# Patient Record
Sex: Male | Born: 1985 | Race: White | Hispanic: No | Smoking: Current every day smoker
Health system: Southern US, Community
[De-identification: ages and names within clinical notes are randomized; demographics above are authoritative.]

## PROBLEM LIST (undated history)

## (undated) DIAGNOSIS — F909 Attention-deficit hyperactivity disorder, unspecified type: Secondary | ICD-10-CM

## (undated) DIAGNOSIS — F410 Panic disorder [episodic paroxysmal anxiety] without agoraphobia: Secondary | ICD-10-CM

## (undated) HISTORY — PX: MOUTH SURGERY: SHX715

---
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
## 2011-01-09 ENCOUNTER — Emergency Department (HOSPITAL_COMMUNITY)
Admission: EM | Admit: 2011-01-09 | Discharge: 2011-01-09 | Payer: Self-pay | Source: Home / Self Care | Admitting: Family Medicine

## 2011-01-10 LAB — POCT RAPID STREP A (OFFICE): Streptococcus, Group A Screen (Direct): POSITIVE — AB

## 2011-05-05 NOTE — Op Note (Signed)
[place]  Patient:    Brandon Hartman, Brandon Hartman                      MRN: [id number] Proc. Date: 05/01/00 Adm. Date:  [id number] Disc. Date: [id number] Attending:  Twana First                           Operative Report  PREOPERATIVE DIAGNOSIS:  Left distal radius and ulna fractures.  POSTOPERATIVE DIAGNOSIS:  Left distal radius and ulna fractures.  OPERATION PERFORMED:  Closed reduction of left distal radius and ulna fractures.  SURGEON:  Elana Alm. Thurston Hole, M.D.  ASSISTANT:  Kirstin Adelberger, P.A.  ANESTHESIA:  General.  OPERATIVE TIME:  30 minutes.  COMPLICATIONS:  None.  INDICATIONS FOR PROCEDURE:  Brandon Hartman is a 25 year old who sustained a displaced distal radius and ulnar fracture last night playing basketball.  Attempted closed reduction in the emergency department last night under IV sedation was unsuccessful and thus, he is to undergo closed reduction versus open reduction, possible pinning under general anesthesia today.  DESCRIPTION OF PROCEDURE:  Brandon Hartman was brought to the operating room on May 01, 2000 and placed on the operating table in supine position.  After an adequate level of general anesthesia was obtained, he had his wrist placed in finger traps and 10 pounds of longitudinal traction.  His splint was removed. Intraoperative fluoroscopy showed the displacement of the distal radius and ulna fractures.  A closed reduction maneuver was performed with traction. Hyperdorsiflexion followed by volar flexion thus reducing the fracture into an anatomic position.  AP and lateral x-rays confirmed anatomic reduction of the fracture and stable reduction was achieved.  Under fluoroscopy the fracture and wrist was moved and only in a hyperdorsiflexion position did the fracture move.  Because of this, I did not feel it was necessary to perform a pinning for stabilization.  He was then placed in a sugartong volarflexed splint and after the  splint hardened, further x-rays were obtained showing that the fracture was maintained in an anatomic position.  After this was done, he was awakened and taken to recovery room in stable condition.  FOLLOW-UP:  He will be treated as an outpatient on Vicodin for pain.  See him back in the office in a week for repeat x-ray and evaluation. DD:  05/01/00 TD:  05/03/00 Job: [id number]

## 2012-03-27 DIAGNOSIS — S0180XA Unspecified open wound of other part of head, initial encounter: Secondary | ICD-10-CM | POA: Insufficient documentation

## 2012-03-27 DIAGNOSIS — W219XXA Striking against or struck by unspecified sports equipment, initial encounter: Secondary | ICD-10-CM | POA: Insufficient documentation

## 2012-03-27 DIAGNOSIS — Z23 Encounter for immunization: Secondary | ICD-10-CM | POA: Insufficient documentation

## 2012-03-28 ENCOUNTER — Emergency Department (HOSPITAL_COMMUNITY)
Admission: EM | Admit: 2012-03-28 | Discharge: 2012-03-28 | Disposition: A | Discharge status: Home / Self Care | Payer: Self-pay | Attending: Emergency Medicine | Admitting: Emergency Medicine

## 2012-03-28 ENCOUNTER — Encounter (HOSPITAL_COMMUNITY): Payer: Self-pay | Admitting: Emergency Medicine

## 2012-03-28 DIAGNOSIS — S0181XA Laceration without foreign body of other part of head, initial encounter: Secondary | ICD-10-CM

## 2012-03-28 MED ORDER — TETANUS-DIPHTH-ACELL PERTUSSIS 5-2.5-18.5 LF-MCG/0.5 IM SUSP
0.5000 mL | Freq: Once | INTRAMUSCULAR | Status: AC
  Administered 2012-03-28: 0.5 mL via INTRAMUSCULAR
  Filled 2012-03-28: qty 0.5

## 2012-03-28 NOTE — ED Provider Notes (Signed)
History     CSN: [id number]  Arrival date & time 03/27/12  2330   First MD Initiated Contact with Patient 03/28/12 0327      Chief Complaint  Patient presents with  . Facial Laceration    (Consider location/radiation/quality/duration/timing/severity/associated sxs/prior treatment) HPI The patient states he was elbowed tonight while playing basketball. He sustained a small laceration below his left eyebrow. The patient denies any loss of consciousness. He denies any double vision or difficulty with his vision. The patient was not sure if you need stitches so he came to the emergency department. History reviewed. No pertinent past medical history.  Past Surgical History  Procedure Date  . Mouth surgery     History reviewed. No pertinent family history.  History  Substance Use Topics  . Smoking status: Current Everyday Smoker    Types: Cigarettes  . Smokeless tobacco: Not on file  . Alcohol Use: No      Review of Systems  All other systems reviewed and are negative.    Allergies  Review of patient's allergies indicates no known allergies.  Home Medications  No current outpatient prescriptions on file.  BP 127/70  Pulse 80  Temp(Src) 98.3 F (36.8 C) (Oral)  Resp 16  SpO2 99%  Physical Exam  Nursing note and vitals reviewed. Constitutional: He appears well-developed and well-nourished. No distress.  HENT:  Head: Normocephalic and atraumatic.  Right Ear: External ear normal.  Left Ear: External ear normal.       Small approximately 1 cm laceration below the left eyebrow superior to the eyelid, no facial bone tenderness  Eyes: Conjunctivae are normal. Right eye exhibits no discharge. Left eye exhibits no discharge. No scleral icterus.  Neck: Neck supple. No tracheal deviation present.  Cardiovascular: Normal rate, regular rhythm and intact distal pulses.   Pulmonary/Chest: Effort normal and breath sounds normal. No stridor. No respiratory distress. He has no  wheezes. He has no rales.  Abdominal: Soft. Bowel sounds are normal. He exhibits no distension. There is no tenderness. There is no rebound and no guarding.  Musculoskeletal: He exhibits no edema and no tenderness.  Neurological: He is alert. He has normal strength. No sensory deficit. Cranial nerve deficit:  no gross defecits noted. He exhibits normal muscle tone. He displays no seizure activity. Coordination normal.  Skin: Skin is warm and dry. No rash noted.  Psychiatric: He has a normal mood and affect.    ED Course  LACERATION REPAIR Performed by: Celene Kras Authorized by: Linwood Dibbles R Consent: Verbal consent obtained. Consent given by: patient Patient identity confirmed: verbally with patient Body area: head/neck Location details: left eyebrow Laceration length: 1 cm Foreign bodies: no foreign bodies Tendon involvement: none Nerve involvement: none Vascular damage: no Irrigation solution: saline Amount of cleaning: standard Skin closure: glue Approximation: close Approximation difficulty: simple   (including critical care time)  Labs Reviewed - No data to display No results found.    MDM   Pt without sign of facial bone fracture or ocular injury.  Laceration repaired.  Pt tolerated well      Celene Kras, MD 03/28/12 [phone]

## 2012-03-28 NOTE — ED Notes (Signed)
derma bond at bedside

## 2012-03-28 NOTE — Discharge Instructions (Signed)
Tissue Adhesive Wound Care A wound can be repaired by using tissue adhesive. Tissue adhesive holds the skin together and allows faster healing. It forms a strong bond on the skin in about 1 minute and reaches its full strength in about 2 or 3 minutes. The adhesive disappears naturally while healing. Follow up is required if your caregiver wants to recheck for infection and to make sure your wound is healing properly.  You may need a tetanus shot if:  You cannot remember when you had your last tetanus shot.   You have never had a tetanus shot.   The injury broke your skin.  If you got a tetanus shot, your arm may swell, get red, and feel warm to the touch. This is common and not a problem. If you need a tetanus shot and you choose not to have one, there is a rare chance of getting tetanus. Sickness from tetanus can be serious. HOME CARE INSTRUCTIONS   Only take over-the-counter or prescription medicines for pain, discomfort, or fever as directed by your caregiver.   Showers are allowed. Do not soak the area containing the tissue adhesive. Do not take baths, swim, or use hot tubs. Do not use any soaps or ointments on the wound until it has healed.   If a bandage (dressing) has been applied, follow your caregiver's instructions for how often to change the dressing.   Keep the dressing dry if one has been applied.   Do not scratch, pick, or rub the adhesive.   Do not place tape over the adhesive. The adhesive could come off when pulling the tape off.   Protect the wound from further injury until it is healed.   Protect the wound from sun and tanning bed exposure while it is healing and for several weeks after healing.   Keep all follow-up appointments as directed by your caregiver.  SEEK IMMEDIATE MEDICAL CARE IF:   Your wound becomes red, swollen, hot, or tender.   You have increasing pain in the wound.   You have a red streak that goes away from the wound.   You have pus coming  from the wound.   You have a fever.   You have shaking chills.   There is a bad smell coming from the wound.   The wound or adhesive breaks open.  MAKE SURE YOU:   Understand these instructions.   Will watch your condition.   Will get help right away if you are not doing well or get worse.  Document Released: 05/30/2001 Document Revised: 11/23/2011 Document Reviewed: 04/09/2011 [employer] Patient Information 2012 [employer]. 

## 2012-03-28 NOTE — ED Notes (Signed)
Pt states tonight while playing basketball someone's elbow caught him right under his left eyebow  Pt has laceration noted  Bleeding controlled

## 2013-06-03 ENCOUNTER — Encounter (HOSPITAL_COMMUNITY): Payer: Self-pay | Admitting: Emergency Medicine

## 2013-06-03 ENCOUNTER — Emergency Department (INDEPENDENT_AMBULATORY_CARE_PROVIDER_SITE_OTHER)
Admission: EM | Admit: 2013-06-03 | Discharge: 2013-06-03 | Disposition: A | Discharge status: Home / Self Care | Payer: Self-pay | Source: Home / Self Care

## 2013-06-03 ENCOUNTER — Emergency Department (INDEPENDENT_AMBULATORY_CARE_PROVIDER_SITE_OTHER): Payer: Self-pay

## 2013-06-03 DIAGNOSIS — S90129A Contusion of unspecified lesser toe(s) without damage to nail, initial encounter: Secondary | ICD-10-CM

## 2013-06-03 DIAGNOSIS — S90122A Contusion of left lesser toe(s) without damage to nail, initial encounter: Secondary | ICD-10-CM

## 2013-06-03 HISTORY — DX: Attention-deficit hyperactivity disorder, unspecified type: F90.9

## 2013-06-03 MED ORDER — OXYCODONE-ACETAMINOPHEN 5-325 MG PO TABS: 1.0000 | ORAL_TABLET | ORAL | Status: DC | PRN

## 2013-06-03 NOTE — ED Notes (Signed)
Patient not ready for discharge, continued discussion on radiology report

## 2013-06-03 NOTE — ED Provider Notes (Signed)
History     CSN: [id number]  Arrival date & time 06/03/13  1002   First MD Initiated Contact with Patient 06/03/13 1020      Chief Complaint  Patient presents with  . Foot Pain    (Consider location/radiation/quality/duration/timing/severity/associated sxs/prior treatment) HPI Comments:  her 27 year old male was at work 4 days ago and dropped to the prior onto his left forefoot. Is complaining primarily of pain in the great toe. Denies other injury.   Past Medical History  Diagnosis Date  . ADHD (attention deficit hyperactivity disorder)     Past Surgical History  Procedure Laterality Date  . Mouth surgery      No family history on file.  History  Substance Use Topics  . Smoking status: Current Every Day Smoker    Types: Cigarettes  . Smokeless tobacco: Not on file  . Alcohol Use: No      Review of Systems  Constitutional: Negative.   Respiratory: Negative.   Gastrointestinal: Negative.   Genitourinary: Negative.   Musculoskeletal:       As per HPI  Skin: Negative.   Neurological: Negative for dizziness, weakness, numbness and headaches.    Allergies  Review of patient's allergies indicates no known allergies.  Home Medications   Current Outpatient Rx  Name  Route  Sig  Dispense  Refill  . Oxycodone-Acetaminophen (PERCOCET PO)   Oral   Take by mouth. Family members medicine         . oxyCODONE-acetaminophen (PERCOCET/ROXICET) 5-325 MG per tablet   Oral   Take 1 tablet by mouth every 4 (four) hours as needed for pain.   12 tablet   0     BP 135/81  Pulse 71  Temp(Src) 98.2 F (36.8 C) (Oral)  Resp 16  SpO2 99%  Physical Exam  Nursing note and vitals reviewed. Constitutional: He is oriented to person, place, and time. He appears well-developed and well-nourished.  HENT:  Head: Normocephalic and atraumatic.  Eyes: EOM are normal.  Neck: Normal range of motion. Neck supple.  Pulmonary/Chest: Effort normal.  Musculoskeletal:   Swelling, redness and tenderness to the left great toe. Lesser tenderness to the adjacent toes and across the forefoot. No deformity or discoloration. Distal neurovascular motor sensory is intact.  Neurological: He is alert and oriented to person, place, and time. No cranial nerve deficit.  Skin: Skin is warm and dry.  Psychiatric: He has a normal mood and affect.    ED Course  Procedures (including critical care time)  Labs Reviewed - No data to display Dg Foot Complete Left  06/03/2013   *RADIOLOGY REPORT*  Clinical Data: Left foot pain following an injury.  The patient reports left foot and ankle fractures approximately 3 years ago.  LEFT FOOT - COMPLETE 3+ VIEW  Comparison: None.  Findings: No fracture, dislocation or radiopaque foreign body. Spur formation arising from the distal medial malleolus and adjacent talus.  IMPRESSION: No fracture.   Original Report Authenticated By: Beckie Salts, M.D.     1. Toe contusion, left, initial encounter       MDM  Postop shoe. Limited weightbearing continue to wear crutches since it is so painful to bear weight and ambulate. Take toes together. And Percocet for pain #12. If not improving call the orthopedist above for appointment.         Hayden Rasmussen, NP 06/03/13 1146

## 2013-06-03 NOTE — ED Provider Notes (Signed)
Medical screening examination/treatment/procedure(s) were performed by resident physician or non-physician practitioner and as supervising physician I was immediately available for consultation/collaboration.   Barkley Bruns MD.   Linna Hoff, MD 06/03/13 [phone]

## 2013-06-03 NOTE — ED Notes (Signed)
Reports dropping a deep fryer on left foot.  Incident occurred on Friday.  Reports significant left great toe pain

## 2015-09-10 ENCOUNTER — Encounter (HOSPITAL_COMMUNITY): Payer: Self-pay | Admitting: Emergency Medicine

## 2015-09-10 ENCOUNTER — Emergency Department (HOSPITAL_COMMUNITY): Payer: Medicaid Other

## 2015-09-10 ENCOUNTER — Emergency Department (HOSPITAL_COMMUNITY)
Admission: EM | Admit: 2015-09-10 | Discharge: 2015-09-10 | Disposition: A | Discharge status: Home / Self Care | Payer: Medicaid Other | Attending: Emergency Medicine | Admitting: Emergency Medicine

## 2015-09-10 DIAGNOSIS — F1210 Cannabis abuse, uncomplicated: Secondary | ICD-10-CM | POA: Insufficient documentation

## 2015-09-10 DIAGNOSIS — F1310 Sedative, hypnotic or anxiolytic abuse, uncomplicated: Secondary | ICD-10-CM

## 2015-09-10 DIAGNOSIS — F1390 Sedative, hypnotic, or anxiolytic use, unspecified, uncomplicated: Secondary | ICD-10-CM | POA: Insufficient documentation

## 2015-09-10 DIAGNOSIS — Z720 Tobacco use: Secondary | ICD-10-CM | POA: Insufficient documentation

## 2015-09-10 DIAGNOSIS — R0602 Shortness of breath: Secondary | ICD-10-CM | POA: Diagnosis present

## 2015-09-10 DIAGNOSIS — R000 Tachycardia, unspecified: Secondary | ICD-10-CM | POA: Diagnosis not present

## 2015-09-10 LAB — CBC WITH DIFFERENTIAL/PLATELET
BASOS PCT: 0 %
Basophils Absolute: 0 10*3/uL (ref 0.0–0.1)
EOS ABS: 0.2 10*3/uL (ref 0.0–0.7)
EOS PCT: 2 %
HCT: 43.2 % (ref 39.0–52.0)
Hemoglobin: 15.1 g/dL (ref 13.0–17.0)
LYMPHS ABS: 3.7 10*3/uL (ref 0.7–4.0)
Lymphocytes Relative: 30 %
MCH: 31.7 pg (ref 26.0–34.0)
MCHC: 35 g/dL (ref 30.0–36.0)
MCV: 90.6 fL (ref 78.0–100.0)
MONOS PCT: 6 %
Monocytes Absolute: 0.8 10*3/uL (ref 0.1–1.0)
Neutro Abs: 7.8 10*3/uL — ABNORMAL HIGH (ref 1.7–7.7)
Neutrophils Relative %: 62 %
PLATELETS: 339 10*3/uL (ref 150–400)
RBC: 4.77 MIL/uL (ref 4.22–5.81)
RDW: 13.6 % (ref 11.5–15.5)
WBC: 12.5 10*3/uL — ABNORMAL HIGH (ref 4.0–10.5)

## 2015-09-10 LAB — I-STAT TROPONIN, ED
TROPONIN I, POC: 0 ng/mL (ref 0.00–0.08)
TROPONIN I, POC: 0 ng/mL (ref 0.00–0.08)

## 2015-09-10 LAB — RAPID URINE DRUG SCREEN, HOSP PERFORMED
AMPHETAMINES: NOT DETECTED
Barbiturates: NOT DETECTED
Benzodiazepines: POSITIVE — AB
Cocaine: NOT DETECTED
Opiates: NOT DETECTED
Tetrahydrocannabinol: POSITIVE — AB

## 2015-09-10 LAB — I-STAT CHEM 8, ED
BUN: 12 mg/dL (ref 6–20)
CALCIUM ION: 1.09 mmol/L — AB (ref 1.12–1.23)
Chloride: 100 mmol/L — ABNORMAL LOW (ref 101–111)
Creatinine, Ser: 1 mg/dL (ref 0.61–1.24)
Glucose, Bld: 123 mg/dL — ABNORMAL HIGH (ref 65–99)
HEMATOCRIT: 49 % (ref 39.0–52.0)
HEMOGLOBIN: 16.7 g/dL (ref 13.0–17.0)
Potassium: 3.3 mmol/L — ABNORMAL LOW (ref 3.5–5.1)
SODIUM: 137 mmol/L (ref 135–145)
TCO2: 21 mmol/L (ref 0–100)

## 2015-09-10 LAB — D-DIMER, QUANTITATIVE (NOT AT ARMC)

## 2015-09-10 MED ORDER — KETOROLAC TROMETHAMINE 30 MG/ML IJ SOLN: 30.0000 mg | Freq: Once | INTRAMUSCULAR | Status: DC

## 2015-09-10 MED ORDER — SODIUM CHLORIDE 0.9 % IV BOLUS (SEPSIS)
1000.0000 mL | Freq: Once | INTRAVENOUS | Status: AC
  Administered 2015-09-10: 1000 mL via INTRAVENOUS

## 2015-09-10 NOTE — ED Provider Notes (Addendum)
CSN: [id number]     Arrival date & time 09/10/15  0125 History   This chart was scribed for Brandon Palumbo, MD by Arlan Organ, ED Scribe. This patient was seen in room D35C/D35C and the patient's care was started 1:47 AM.   Chief Complaint  Patient presents with  . Shortness of Breath   Patient is a 29 y.o. male presenting with shortness of breath. The history is provided by the patient. No language interpreter was used.  Shortness of Breath Severity:  Moderate Onset quality:  Sudden Duration:  3 hours Timing:  Constant Progression:  Unchanged Chronicity:  Recurrent Context: not activity and not URI   Relieved by:  Position changes Worsened by:  Nothing tried Ineffective treatments:  None tried Associated symptoms: no abdominal pain, no chest pain, no cough, no fever, no headaches, no rash and no vomiting   Risk factors: tobacco use   Risk factors: no recent alcohol use, no hx of PE/DVT and no prolonged immobilization     HPI Comments: Brandon Hartman is a 29 y.o. male without any pertinent past medical history who presents to the Emergency Department complaining of constant, ongoing shortness of breath onset this evening. Pt states SOB is improved when flat. He states "i feel like my heart is racing". Mr. Brandon Hartman admits to a history of same 8 months ago. Denies any new medications. No recent trauma or falls. No recent long distance travel. He admits to Marijuana use this evening. In addition, he is every day smoker and smokes 1 pack of cigarettes daily.   Past Medical History  Diagnosis Date  . ADHD (attention deficit hyperactivity disorder)    Past Surgical History  Procedure Laterality Date  . Mouth surgery     No family history on file. Social History  Substance Use Topics  . Smoking status: Current Every Day Smoker    Types: Cigarettes  . Smokeless tobacco: Not on file  . Alcohol Use: No    Review of Systems  Constitutional: Negative for fever and chills.  HENT:  Negative for congestion.   Respiratory: Positive for shortness of breath. Negative for cough.   Cardiovascular: Negative for chest pain.  Gastrointestinal: Negative for nausea, vomiting, abdominal pain and diarrhea.  Musculoskeletal: Negative for back pain.  Skin: Negative for rash.  Neurological: Negative for headaches.  Psychiatric/Behavioral: Negative for confusion.  All other systems reviewed and are negative.     Allergies  Review of patient's allergies indicates no known allergies.  Home Medications   Prior to Admission medications   Medication Sig Start Date End Date Taking? Authorizing Provider  Oxycodone-Acetaminophen (PERCOCET PO) Take by mouth. Family members medicine    Historical Provider, MD  oxyCODONE-acetaminophen (PERCOCET/ROXICET) 5-325 MG per tablet Take 1 tablet by mouth every 4 (four) hours as needed for pain. 06/03/13   Hayden Rasmussen, NP   Triage Vitals: BP 145/80 mmHg  Pulse 142  Temp(Src) 99 F (37.2 C) (Oral)  Resp 26  SpO2 99%   Physical Exam  Constitutional: He is oriented to person, place, and time. He appears well-developed and well-nourished.  HENT:  Head: Normocephalic and atraumatic.  Eyes: EOM are normal.  Neck: Normal range of motion.  Cardiovascular: Regular rhythm, S1 normal, S2 normal, normal heart sounds and intact distal pulses.  Tachycardia present.   Pulmonary/Chest: Effort normal and breath sounds normal. No respiratory distress.  Abdominal: Soft. He exhibits no distension. There is no tenderness.  Musculoskeletal: Normal range of motion.  Neurological: He is  alert and oriented to person, place, and time.  Skin: Skin is warm and dry.  Psychiatric: He has a normal mood and affect. Judgment normal.  Nursing note and vitals reviewed.   ED Course  Procedures (including critical care time)  DIAGNOSTIC STUDIES: Oxygen Saturation is 99% on RA, Normal by my interpretation.    COORDINATION OF CARE: 1:58 AM- Will give fluids. Will order  urine rapid drug screen, CBC, i-stat chem 8, i-stat troponin, and EKG. Discussed treatment plan with pt at bedside and pt agreed to plan.     Labs Review Labs Reviewed  CBC WITH DIFFERENTIAL/PLATELET - Abnormal; Notable for the following:    WBC 12.5 (*)    Neutro Abs 7.8 (*)    All other components within normal limits  I-STAT CHEM 8, ED - Abnormal; Notable for the following:    Potassium 3.3 (*)    Chloride 100 (*)    Glucose, Bld 123 (*)    Calcium, Ion 1.09 (*)    All other components within normal limits  D-DIMER, QUANTITATIVE (NOT AT [facility])  URINE RAPID DRUG SCREEN, HOSP PERFORMED  I-STAT TROPOININ, ED    Imaging Review Dg Chest Portable 1 View  09/10/2015   CLINICAL DATA:  Palpitations and shortness of breath tonight.  EXAM: PORTABLE CHEST 1 VIEW  COMPARISON:  None.  FINDINGS: The heart size and mediastinal contours are within normal limits. Both lungs are clear. The visualized skeletal structures are unremarkable.  IMPRESSION: No active disease.   Electronically Signed   By: Burman Nieves M.D.   On: 09/10/2015 02:10   I have personally reviewed and evaluated these images and lab results as part of my medical decision-making.   EKG Interpretation   Date/Time:  Friday September 10 2015 01:35:35 EDT Ventricular Rate:  142 PR Interval:  112 QRS Duration: 106 QT Interval:  358 QTC Calculation: 550 R Axis:   84 Text Interpretation:  Sinus tachycardia Incomplete right bundle branch  block ST \\T \ T wave abnormality, consider inferior ischemia Confirmed by  [facility]  MD, Brandon ([id number]) on 09/10/2015 1:45:36 AM      MDM   Final diagnoses:  None   Ruled out for MI with 2 negative troponins, will refer to cardiology for rate related EKG changes.  Ruled out for PE with negative DDimer in a low risk patient.  HR markedly improved with IVF  Suspect withdrawal from benzos (he is not prescribed them and only takes them when he can get them) and marijuana use are the cause  of this tachycardia  EKG repeated post fluids:  EKG Interpretation  Date/Time:  Friday September 10 2015 04:36:56 EDT Ventricular Rate:  70 PR Interval:  128 QRS Duration: 123 QT Interval:  401 QTC Calculation: 433 R Axis:   59 Text Interpretation:  Sinus rhythm Confirmed by [facility]  MD, Brandon ([id number]) on 09/10/2015 4:41:46 AM     Stop marijuana and sporadic use of benzos.    And has normalized.  Patient informed he must follow up with cardiology for ongoing testing and care.  Patient verbalizes understanding and agrees to follow up  I personally performed the services described in this documentation, which was scribed in my presence. The recorded information has been reviewed and is accurate.   Cy Blamer, MD 09/10/15 0434  Brandon Palumbo, MD 09/10/15 [phone]

## 2015-09-10 NOTE — ED Notes (Signed)
Contacted lab to add on d-dimer

## 2015-09-10 NOTE — ED Notes (Signed)
Pt c/o sudden onset of shortness of breath prior to arrival. Pt reports playing a game on his phone tonight when he felt "like my heart was racing and I couldn't catch my breath." Pt has been having anxiety since June; has not been diagnosed; states taking xanax "when I can get one." Pt denies hx of panic attacks, "feels like I'm having one."

## 2015-09-10 NOTE — ED Notes (Signed)
Presents with onset of tachycardia and SOB began this evening-sitting forward feels better and laying back makes it worse. Hx of SOB that began 2 months ago and comes and goes- HR 138, BP 145/80 states, "It feels like my heart is going to pop out of my chest"

## 2015-09-16 ENCOUNTER — Emergency Department (HOSPITAL_COMMUNITY)
Admission: EM | Admit: 2015-09-16 | Discharge: 2015-09-16 | Disposition: A | Discharge status: Home / Self Care | Payer: Medicaid Other | Attending: Emergency Medicine | Admitting: Emergency Medicine

## 2015-09-16 ENCOUNTER — Encounter (HOSPITAL_COMMUNITY): Payer: Self-pay | Admitting: *Deleted

## 2015-09-16 DIAGNOSIS — Z720 Tobacco use: Secondary | ICD-10-CM | POA: Insufficient documentation

## 2015-09-16 DIAGNOSIS — F419 Anxiety disorder, unspecified: Secondary | ICD-10-CM | POA: Insufficient documentation

## 2015-09-16 DIAGNOSIS — R002 Palpitations: Secondary | ICD-10-CM | POA: Diagnosis present

## 2015-09-16 LAB — I-STAT CHEM 8, ED
BUN: 11 mg/dL (ref 6–20)
CHLORIDE: 101 mmol/L (ref 101–111)
Calcium, Ion: 1.14 mmol/L (ref 1.12–1.23)
Creatinine, Ser: 1 mg/dL (ref 0.61–1.24)
Glucose, Bld: 124 mg/dL — ABNORMAL HIGH (ref 65–99)
HCT: 49 % (ref 39.0–52.0)
HEMOGLOBIN: 16.7 g/dL (ref 13.0–17.0)
POTASSIUM: 3.5 mmol/L (ref 3.5–5.1)
Sodium: 138 mmol/L (ref 135–145)
TCO2: 23 mmol/L (ref 0–100)

## 2015-09-16 LAB — I-STAT TROPONIN, ED: Troponin i, poc: 0 ng/mL (ref 0.00–0.08)

## 2015-09-16 MED ORDER — LORAZEPAM 1 MG PO TABS
1.0000 mg | ORAL_TABLET | Freq: Once | ORAL | Status: AC
Start: 2015-09-16 — End: 2015-09-16
  Administered 2015-09-16: 1 mg via ORAL
  Filled 2015-09-16: qty 1

## 2015-09-16 MED ORDER — LORAZEPAM 1 MG PO TABS: 1.0000 mg | ORAL_TABLET | Freq: Four times a day (QID) | ORAL | Status: DC | PRN

## 2015-09-16 NOTE — Discharge Instructions (Signed)
Ativan as prescribed as needed for anxiety.  Follow-up with a primary Dr. if symptoms are persisting longer than the next week.   Panic Attacks Panic attacks are sudden, short-livedsurges of severe anxiety, fear, or discomfort. They may occur for no reason when you are relaxed, when you are anxious, or when you are sleeping. Panic attacks may occur for a number of reasons:   Healthy people occasionally have panic attacks in extreme, life-threatening situations, such as war or natural disasters. Normal anxiety is a protective mechanism of the body that helps [place] react to danger (fight or flight response).  Panic attacks are often seen with anxiety disorders, such as panic disorder, social anxiety disorder, generalized anxiety disorder, and phobias. Anxiety disorders cause excessive or uncontrollable anxiety. They may interfere with your relationships or other life activities.  Panic attacks are sometimes seen with other mental illnesses, such as depression and posttraumatic stress disorder.  Certain medical conditions, prescription medicines, and drugs of abuse can cause panic attacks. SYMPTOMS  Panic attacks start suddenly, peak within 20 minutes, and are accompanied by four or more of the following symptoms:  Pounding heart or fast heart rate (palpitations).  Sweating.  Trembling or shaking.  Shortness of breath or feeling smothered.  Feeling choked.  Chest pain or discomfort.  Nausea or strange feeling in your stomach.  Dizziness, light-headedness, or feeling like you will faint.  Chills or hot flushes.  Numbness or tingling in your lips or hands and feet.  Feeling that things are not real or feeling that you are not yourself.  Fear of losing control or going crazy.  Fear of dying. Some of these symptoms can mimic serious medical conditions. For example, you may think you are having a heart attack. Although panic attacks can be very scary, they are not life  threatening. DIAGNOSIS  Panic attacks are diagnosed through an assessment by your health care provider. Your health care provider will ask questions about your symptoms, such as where and when they occurred. Your health care provider will also ask about your medical history and use of alcohol and drugs, including prescription medicines. Your health care provider may order blood tests or other studies to rule out a serious medical condition. Your health care provider may refer you to a mental health professional for further evaluation. TREATMENT   Most healthy people who have one or two panic attacks in an extreme, life-threatening situation will not require treatment.  The treatment for panic attacks associated with anxiety disorders or other mental illness typically involves counseling with a mental health professional, medicine, or a combination of both. Your health care provider will help determine what treatment is best for you.  Panic attacks due to physical illness usually go away with treatment of the illness. If prescription medicine is causing panic attacks, talk with your health care provider about stopping the medicine, decreasing the dose, or substituting another medicine.  Panic attacks due to alcohol or drug abuse go away with abstinence. Some adults need professional help in order to stop drinking or using drugs. HOME CARE INSTRUCTIONS   Take all medicines as directed by your health care provider.   Schedule and attend follow-up visits as directed by your health care provider. It is important to keep all your appointments. SEEK MEDICAL CARE IF:  You are not able to take your medicines as prescribed.  Your symptoms do not improve or get worse. SEEK IMMEDIATE MEDICAL CARE IF:   You experience panic attack symptoms that are  different than your usual symptoms.  You have serious thoughts about hurting yourself or others.  You are taking medicine for panic attacks and have a  serious side effect. MAKE SURE YOU:  Understand these instructions.  Will watch your condition.  Will get help right away if you are not doing well or get worse. Document Released: 12/04/2005 Document Revised: 12/09/2013 Document Reviewed: 07/18/2013 [facility] Patient Information 2015 [place], [place]. This information is not intended to replace advice given to you by your health care provider. Make sure you discuss any questions you have with your health care provider.    Emergency Department Resource Guide 1) Find a Doctor and Pay Out of Pocket Although you won't have to find out who is covered by your insurance plan, it is a good idea to ask around and get recommendations. You will then need to call the office and see if the doctor you have chosen will accept you as a new patient and what types of options they offer for patients who are self-pay. Some doctors offer discounts or will set up payment plans for their patients who do not have insurance, but you will need to ask so you aren't surprised when you get to your appointment.  2) Contact Your Local Health Department Not all health departments have doctors that can see patients for sick visits, but many do, so it is worth a call to see if yours does. If you don't know where your local health department is, you can check in your phone book. The CDC also has a tool to help you locate your state's health department, and many state websites also have listings of all of their local health departments.  3) Find a Walk-in Clinic If your illness is not likely to be very severe or complicated, you may want to try a walk in clinic. These are popping up all over the country in pharmacies, drugstores, and shopping centers. They're usually staffed by nurse practitioners or physician assistants that have been trained to treat common illnesses and complaints. They're usually fairly quick and inexpensive. However, if you have serious medical issues or  chronic medical problems, these are probably not your best option.  No Primary Care Doctor: - Call Health Connect at  [phone] - they can help you locate a primary care doctor that  accepts your insurance, provides certain services, etc. - Physician Referral Service- [phone]  Chronic Pain Problems: Organization         Address  Phone   Notes  [facility]  [phone] Patients need to be referred by their primary care doctor.   Medication Assistance: Organization         Address  Phone   Notes  [facility] Medication [facility] [address] [place] [place], [place] [address] [phone] --Must be a resident of [facility] -- Must have NO insurance coverage whatsoever (no Medicaid/ Medicare, etc.) -- The pt. MUST have a primary care doctor that directs their care regularly and follows them in the community   [facility]  [phone]   [employer]  [phone]    Agencies that provide inexpensive medical care: Organization         Address  Phone   Notes  [facility]  [phone]   [facility]    [phone]   [facility] [address] [place], [place] [address] [phone]   [facility]  [address] [place] [phone]   Planned Parenthood    [phone]   [facility]    [phone]   [facility] and [facility]  [address], [place] Phone:  [phone], Fax:  [phone] Hours of Operation:  9 am - 6 pm, M-F.  Also accepts Medicaid/Medicare and self-pay.  [facility]  [address], [place] Phone: [phone], Fax: [phone]. Hours of Operation:  8:30 am - 5:30 pm, M-F.  Also accepts Medicaid and self-pay.  [facility] [place] [address], [place] Phone: [phone]   [facility] [address] Natasha Bence [place], [place]  [phone], Ext. [phone] Mondays & Thursdays: 7-9 AM.  First 15 patients are seen on a first come, first serve basis.    Medicaid-accepting [facility] Providers:  Organization         Address  Phone   Notes  [facility] [address], [place] [phone] Also accepts self-pay patients.  [facility] [address] Laurell Josephs [place], [place]  [phone]   [facility] [place] [facility] [address], [place] [phone]   [facility] [address], [place] [phone]   Renaye Rakers [address], [place]   [phone] Only accepts [place] Access [place] patients after they have their name applied to their card.   Self-Pay (no insurance) in [facility]:  Organization         Address  Phone   Notes  Sickle Cell Patients, [facility] Internal Medicine [address] [place], [place] [phone]   [facility] [address] [place], [place] [phone]   [facility] [place]  [address], [place] [phone]   [facility]/Dr. Osei-Bonsu  [address] [place] or [address], [place] [phone] Phone number for both [place] and [place] locations is the same.  [facility] [address], [place] [phone]   [facility] [address], [place] or [address] [phone]   [facility] [address] [place] [phone], phone; [phone], fax Sees patients 1st and 3rd Saturday of every month.  Must not qualify for public or private insurance (i.e. Medicaid, Medicare, [place] Health Choice, Veterans' Benefits)  Household income should be no more than 200% of the poverty level The clinic cannot treat you if you are pregnant or think you are pregnant  Sexually transmitted  diseases are not treated at the clinic.    Dental Care: Organization         Address  Phone  Notes  [facility] Department of [facility] : [facility] [address] [place], [place] [phone] Accepts children up to age 8 who are enrolled in [place] or [place] Health Choice; pregnant women with a Medicaid card; and children who have applied for Medicaid or [place] Health Choice, but were declined, whose parents can pay a reduced fee at time of service.  [facility] Department of [facility]  [address], [place] [phone] Accepts children up to age 45 who are enrolled in [place] or  Health Choice; pregnant women with a Medicaid card; and children who have applied  for Medicaid or [place] Health Choice, but were declined, whose parents can pay a reduced fee at time of service.  [facility]  [address] [place], [place] [phone] Patients are seen by appointment only. Walk-ins are not accepted. [facility] will see patients 62 years of age and older. Monday - Tuesday (8am-5pm) Most Wednesdays (8:30-5pm) $30 per visit, cash only  [facility] Adult [facility]  [address], [facility] [phone] Patients are seen by appointment only. Walk-ins are not accepted. [facility] will see patients 95 years of age and older. One Wednesday Evening (Monthly: Volunteer Based).  $30 per visit, cash only  [employer]  [phone] for adults; Children under age 23, call Graduate Pediatric Dentistry at [phone]. Children aged 50-14, please call [phone] to request a pediatric application.  Dental services are provided in all areas of dental care including fillings, crowns and bridges, complete and partial dentures, implants, gum treatment, root canals, and extractions. Preventive care is also provided. Treatment is provided to both adults and children. Patients are selected via a  lottery and there is often a waiting list.   [facility] [address] [place]  [phone] [email or web address]   [facility] [address] [place], [place] [phone], Ext. [phone] Second and Fourth Thursday of each month, opens at 6:30 AM; Clinic ends at 9 AM.  Patients are seen on a first-come first-served basis, and a limited number are seen during each clinic.   [facility]  [address] Ether Griffins [place], [place] [phone]   Eligibility Requirements You must have lived in [place], [place], or [place] counties for at least the last three months.   You cannot be eligible for state or federal sponsored [place], including [employer], [place], or [employer].   You generally cannot be eligible for healthcare insurance through your employer.    How to apply: Eligibility screenings are held every Tuesday and Wednesday afternoon from 1:00 pm until 4:00 pm. You do not need an appointment for the interview!  [facility] [address] [place], [place] [phone]   [facility]  [phone]   [facility] Health Department  [phone]   [facility]  [phone]    Behavioral Health Resources in the Community: Intensive Outpatient Programs Organization         Address  Phone  Notes  [facility] Services [address], [place], [place] [phone]   [facility] Outpatient [address] [place], [place] [phone]   [facility] [address], [place], [place]  [phone]   [facility] Mental Health [address],  [place], [place] [phone] or [phone]   Substance Abuse Resources Organization         Address  Phone  Notes  [facility]  [phone]   [facility]  [phone]   [facility] [place] [facility]  [phone]   Floydene Flock  [phone]   Residential &  Outpatient Substance Abuse Program  [phone]   Psychological Services Organization         Address  Phone  Notes  [facility]  [phone]   [facility]  [phone]   [facility] [address] [place] [phone] or [phone]    Mobile Crisis Teams Organization  Address  Phone  Notes  [facility], Mobile Crisis Care Unit  [phone]   [facility]  [address] [place], [place]   [facility] [address] [place] [phone]    Self-Help/Support Groups Organization         Address  Phone             Notes  [place] [facility] [place] - variety of support groups  [facility] Call for more information  Narcotics Anonymous (NA), [facility] [address], [employer] [place]  2 meetings at this location   Special educational needs teacher         Address  Phone  Notes  [facility] [place],    [place]  [phone]   [facility]  [address] [place] [phone], [place], [place]   [place], [place] [phone] Admissions: 8am-3pm M-F  Incentives Substance [place] [address],    [place], [place] [phone]   [facility] [address] [place], [place], [place]   [facility] [address]  [place], [place]   [facility] - Intensive Outpatient [address] Kristeen Mans [address], [place], [place]   [facility] [place]  [place], [place] [phone] or [phone]   [facility] [address] [place], [place] Accepts Medicaid  [facility] [address]  [place] [phone] Substance Abuse/Addiction Treatment   [facility] Organization          Address  Phone  Notes  [facility]  [phone]   Domenic Schwab, PhD [address] Arlis Porta [place], [place]   [phone] or [phone]   [place], [place] [phone]   [facility] [address] [place], [place] [phone] Insurance/Medicaid/sponsorship through [facility] and Families [address], [address] [place]                                    [place], [place] [phone] [place] [address], [place] [phone]    Dr. Adele Schilder  [phone]   [facility] [place] [facility] Dept. 1) [address] [place] 2) [place] 3)  [place] [address], [place] [phone]  [phone]   [place] [phone] or [phone] (After Hours)

## 2015-09-16 NOTE — ED Provider Notes (Signed)
CSN: [id number]     Arrival date & time 09/16/15  0310 History  By signing my name below, I, Freida Busman, attest that this documentation has been prepared under the direction and in the presence of Geoffery Lyons, MD . Electronically Signed: Freida Busman, Scribe. 09/16/2015. 3:31 AM.   Chief Complaint  Patient presents with  . Panic Attack    The history is provided by the patient. No language interpreter was used.     HPI Comments:  Brandon Hartman is a 29 y.o. male who presents to the Emergency Department complaining of palpations; states his heart is pounding. His symptom started ~0200 this AM while watching TV. He reports associated lightheadedness. Pt  reports a history of same, states he was evaluated in ED 09/10/2015; notes a decrease in caffeine intake since that visit. He denies cardiac history and CP. No alleviating factors noted. He notes he was taking xanax PRN and his last dose was ~ 2 weeks ago. He admits to occasional marijuana use and is a current everyday smoker.  Past Medical History  Diagnosis Date  . ADHD (attention deficit hyperactivity disorder)    Past Surgical History  Procedure Laterality Date  . Mouth surgery     History reviewed. No pertinent family history. Social History  Substance Use Topics  . Smoking status: Current Every Day Smoker    Types: Cigarettes  . Smokeless tobacco: Never Used  . Alcohol Use: No    Review of Systems  Constitutional: Negative for fever.  Respiratory: Negative for shortness of breath.   Cardiovascular: Positive for palpitations. Negative for chest pain.  Neurological: Positive for light-headedness.    Allergies  Review of patient's allergies indicates no known allergies.  Home Medications   Prior to Admission medications   Medication Sig Start Date End Date Taking? Authorizing Provider  oxyCODONE-acetaminophen (PERCOCET/ROXICET) 5-325 MG per tablet Take 1 tablet by mouth every 4 (four) hours as needed for  pain. Patient not taking: Reported on 09/10/2015 06/03/13   Hayden Rasmussen, NP   BP 144/92 mmHg  Pulse 94  Temp(Src) 98.1 F (36.7 C) (Oral)  Resp 20  SpO2 100% Physical Exam  Constitutional: He is oriented to person, place, and time. He appears well-developed and well-nourished. No distress.  Appears somewhat anxious  HENT:  Head: Normocephalic and atraumatic.  Eyes: EOM are normal.  Neck: Normal range of motion.  Cardiovascular: Normal rate, regular rhythm, normal heart sounds and intact distal pulses.   Pulmonary/Chest: Effort normal and breath sounds normal. No respiratory distress.  Abdominal: Soft. He exhibits no distension. There is no tenderness.  Musculoskeletal: Normal range of motion.  Neurological: He is alert and oriented to person, place, and time.  Skin: Skin is warm and dry.  Psychiatric: He has a normal mood and affect. Judgment normal.  Nursing note and vitals reviewed.   ED Course  Procedures   DIAGNOSTIC STUDIES:  Oxygen Saturation is 100% on RA, normal by my interpretation.    COORDINATION OF CARE:  3:26 AM Discussed treatment plan with pt at bedside and pt agreed to plan.  Labs Review Labs Reviewed  Rosezena Sensor, ED  I-STAT CHEM 8, ED    Imaging Review No results found. I have personally reviewed and evaluated these images and lab results as part of my medical decision-making.   EKG Interpretation   Date/Time:  Thursday September 16 2015 03:16:27 EDT Ventricular Rate:  84 PR Interval:  127 QRS Duration: 123 QT Interval:  388 QTC Calculation: 459  R Axis:   53 Text Interpretation:  Sinus rhythm Incomplete RBBB Confirmed by DELO  MD,  DOUGLAS ([id number]) on 09/16/2015 3:19:53 AM      MDM   Final diagnoses:  None    Patient presents with complaints of palpitations and anxiety. His EKG is unremarkable and laboratory studies are normal. He is feeling better after 1 mg of Ativan. I doubt a cardiac etiology and suspect his symptoms are  anxiety related. I will prescribe a small quantity of Ativan which she can take as needed if his symptoms recur.  I personally performed the services described in this documentation, which was scribed in my presence. The recorded information has been reviewed and is accurate.      Geoffery Lyons, MD 09/16/15 [phone]

## 2015-09-16 NOTE — ED Notes (Signed)
Pt. Was watching a movie and states he just had a panic attack.

## 2015-09-16 NOTE — ED Notes (Signed)
Pt reports the Panic attacks began in June of this year.  He reports "lightheadedness, jumpy, fugitie, heart races and it feels like I am dying."   States he had two episodes this week.

## 2015-11-30 ENCOUNTER — Encounter: Payer: Self-pay | Admitting: Family Medicine

## 2015-11-30 ENCOUNTER — Ambulatory Visit: Payer: Medicaid Other | Attending: Family Medicine | Admitting: Family Medicine

## 2015-11-30 VITALS — BP 133/85 | HR 65 | Temp 98.0°F | Resp 16 | Ht 72.0 in | Wt 246.0 lb

## 2015-11-30 DIAGNOSIS — F909 Attention-deficit hyperactivity disorder, unspecified type: Secondary | ICD-10-CM | POA: Insufficient documentation

## 2015-11-30 DIAGNOSIS — Z79899 Other long term (current) drug therapy: Secondary | ICD-10-CM | POA: Diagnosis not present

## 2015-11-30 DIAGNOSIS — G4700 Insomnia, unspecified: Secondary | ICD-10-CM | POA: Insufficient documentation

## 2015-11-30 DIAGNOSIS — F411 Generalized anxiety disorder: Secondary | ICD-10-CM | POA: Diagnosis not present

## 2015-11-30 DIAGNOSIS — F17200 Nicotine dependence, unspecified, uncomplicated: Secondary | ICD-10-CM

## 2015-11-30 DIAGNOSIS — F17210 Nicotine dependence, cigarettes, uncomplicated: Secondary | ICD-10-CM | POA: Insufficient documentation

## 2015-11-30 MED ORDER — TRAZODONE HCL 50 MG PO TABS: 50.0000 mg | ORAL_TABLET | Freq: Every evening | ORAL | Status: DC | PRN

## 2015-11-30 MED ORDER — HYDROXYZINE PAMOATE 25 MG PO CAPS: 100.0000 mg | ORAL_CAPSULE | Freq: Three times a day (TID) | ORAL | Status: DC | PRN

## 2015-11-30 NOTE — Patient Instructions (Signed)
Generalized Anxiety Disorder Generalized anxiety disorder (GAD) is a mental disorder. It interferes with life functions, including relationships, work, and school. GAD is different from normal anxiety, which everyone experiences at some point in their lives in response to specific life events and activities. Normal anxiety actually helps us prepare for and get through these life events and activities. Normal anxiety goes away after the event or activity is over.  GAD causes anxiety that is not necessarily related to specific events or activities. It also causes excess anxiety in proportion to specific events or activities. The anxiety associated with GAD is also difficult to control. GAD can vary from mild to severe. People with severe GAD can have intense waves of anxiety with physical symptoms (panic attacks).  SYMPTOMS The anxiety and worry associated with GAD are difficult to control. This anxiety and worry are related to many life events and activities and also occur more days than not for 6 months or longer. People with GAD also have three or more of the following symptoms (one or more in children):  Restlessness.   Fatigue.  Difficulty concentrating.   Irritability.  Muscle tension.  Difficulty sleeping or unsatisfying sleep. DIAGNOSIS GAD is diagnosed through an assessment by your health care provider. Your health care provider will ask you questions aboutyour mood,physical symptoms, and events in your life. Your health care provider may ask you about your medical history and use of alcohol or drugs, including prescription medicines. Your health care provider may also do a physical exam and blood tests. Certain medical conditions and the use of certain substances can cause symptoms similar to those associated with GAD. Your health care provider may refer you to a mental health specialist for further evaluation. TREATMENT The following therapies are usually used to treat GAD:    Medication. Antidepressant medication usually is prescribed for long-term daily control. Antianxiety medicines may be added in severe cases, especially when panic attacks occur.   Talk therapy (psychotherapy). Certain types of talk therapy can be helpful in treating GAD by providing support, education, and guidance. A form of talk therapy called cognitive behavioral therapy can teach you healthy ways to think about and react to daily life events and activities.  Stress managementtechniques. These include yoga, meditation, and exercise and can be very helpful when they are practiced regularly. A mental health specialist can help determine which treatment is best for you. Some people see improvement with one therapy. However, other people require a combination of therapies.   This information is not intended to replace advice given to you by your health care provider. Make sure you discuss any questions you have with your health care provider.   Document Released: 03/31/2013 Document Revised: 12/25/2014 Document Reviewed: 03/31/2013 Elsevier Interactive Patient Education 2016 Elsevier Inc.  

## 2015-11-30 NOTE — Progress Notes (Signed)
CC: Anxiety  HPI: Brandon Hartman is a 29 y.o. male who presents with a six-month history of panic attacks which occur at night and have been going on since 05/2015. He endorses a lot of anxiety and insomnia which also started around the same time in 05/2015. Symptoms he experiences include rapid heart rate, trouble breathing, chest tightness. He denies any inciting incidents and states he lies in bed and is unable to fall asleep till about 4 or 5 AM. Denies use of coffee negative for drugs; he does smoke. Admits to occasions where he has low energy, anhedonia and on the other hand there are other times when he feels so pumped up and full of energy. The only medication he uses at this time is Prilosec OTC. Patient has No headache, No chest pain, No abdominal pain - No Nausea, No new weakness tingling or numbness, No Cough - SOB.  No Known Allergies Past Medical History  Diagnosis Date  . ADHD (attention deficit hyperactivity disorder)    Current Outpatient Prescriptions on File Prior to Visit  Medication Sig Dispense Refill  . LORazepam (ATIVAN) 1 MG tablet Take 1 tablet (1 mg total) by mouth every 6 (six) hours as needed for anxiety. 6 tablet 0  . omeprazole (PRILOSEC OTC) 20 MG tablet Take 20 mg by mouth daily.     No current facility-administered medications on file prior to visit.   History reviewed. No pertinent family history. Social History   Social History  . Marital Status: Single    Spouse Name: N/A  . Number of Children: N/A  . Years of Education: N/A   Occupational History  . Not on file.   Social History Main Topics  . Smoking status: Current Every Day Smoker    Types: Cigarettes  . Smokeless tobacco: Never Used  . Alcohol Use: No  . Drug Use: Yes    Special: Marijuana  . Sexual Activity: Not on file   Other Topics Concern  . Not on file   Social History Narrative    Review of Systems: Constitutional: Negative for fever, chills, diaphoresis, activity  change, appetite change and fatigue. HENT: Negative for ear pain, nosebleeds, congestion, facial swelling, rhinorrhea, neck pain, neck stiffness and ear discharge.  Eyes: Negative for pain, discharge, redness, itching and visual disturbance. Respiratory: Negative for cough, choking, chest tightness, shortness of breath, wheezing and stridor.  Cardiovascular: Negative for chest pain, palpitations and leg swelling. Gastrointestinal: Negative for abdominal distention. Genitourinary: Negative for dysuria, urgency, frequency, hematuria, flank pain, decreased urine volume, difficulty urinating and dyspareunia.  Musculoskeletal: Negative for back pain, joint swelling, arthralgias and gait problem. Neurological: Negative for dizziness, tremors, seizures, syncope, facial asymmetry, speech difficulty, weakness, light-headedness, numbness and headaches.  Hematological: Negative for adenopathy. Does not bruise/bleed easily. Psychiatric/Behavioral: Negative for hallucinations, behavioral problems, positive for anxiety, dysphoric mood, insomnia    Objective:   Filed Vitals:   11/30/15 1425  BP: 133/85  Pulse: 65  Temp: 98 F (36.7 C)  Resp: 16    Physical Exam: Constitutional: Patient appears well-developed and well-nourished. No distress. HENT: Normocephalic, atraumatic, External right and left ear normal. Oropharynx is clear and moist.  Eyes: Conjunctivae and EOM are normal. PERRLA, no scleral icterus. Neck: Normal ROM. Neck supple. No JVD. No tracheal deviation. No thyromegaly. CVS: RRR, S1/S2 +, no murmurs, no gallops, no carotid bruit.  Pulmonary: Effort and breath sounds normal, no stridor, rhonchi, wheezes, rales.  Abdominal: Soft. BS +,  no distension, tenderness, rebound or  guarding.  Musculoskeletal: Normal range of motion. No edema and no tenderness.  Lymphadenopathy: No lymphadenopathy noted, cervical, inguinal or axillary Neuro: Alert. Normal reflexes, muscle tone coordination. No  cranial nerve deficit. Skin: Skin is warm and dry. No rash noted. Not diaphoretic. No erythema. No pallor. Psychiatric: Normal mood and affect. Behavior, judgment, thought content normal.  Lab Results  Component Value Date   WBC 12.5* 09/10/2015   HGB 16.7 09/16/2015   HCT 49.0 09/16/2015   MCV 90.6 09/10/2015   PLT 339 09/10/2015   Lab Results  Component Value Date   CREATININE 1.00 09/16/2015   BUN 11 09/16/2015   NA 138 09/16/2015   K 3.5 09/16/2015   CL 101 09/16/2015       Assessment and plan:   Anxiety: I will place him on hydroxyzine. Bipolar disorder will need to be ruled out and so I have referred him to psychiatry.  Insomnia: Sleep hygiene discussed. Placed on trazodone.  This note has been created with Education officer, environmental. Any transcriptional errors are unintentional.     Jaclyn Shaggy, MD. [facility] [phone] 11/30/2015, 2:54 PM

## 2015-11-30 NOTE — Progress Notes (Signed)
Patient here to est.care, ED f/up for anxiety.   Patient reports feeling pretty good. Patient needs refills.

## 2015-12-01 ENCOUNTER — Telehealth: Payer: Self-pay | Admitting: Clinical

## 2015-12-01 NOTE — Telephone Encounter (Signed)
Attempt to f/u with patient, left HIPPA-compliant message to return [facility] at [phone].

## 2015-12-07 ENCOUNTER — Telehealth: Payer: Self-pay | Admitting: Clinical

## 2015-12-07 NOTE — Telephone Encounter (Signed)
Returned pt call, requesting more hydroxyzine, as he had a "bad panic attack" last night (12-06-15), and will be unable to get [facility] until next week; he has 10 pills left. Pt informed that he is to take that medication as needed only, he does not need to take 4 capsules, 3 times/day on a daily basis. Pt now states that he understands that he only needs to take the hydroxyzine as needed, and that he will still go to [facility] next [facility]. He has had no panic attacks since the one last night, and says he "feel fine today".

## 2016-01-05 ENCOUNTER — Ambulatory Visit: Payer: Medicaid Other | Attending: Family Medicine | Admitting: Family Medicine

## 2016-01-05 ENCOUNTER — Encounter: Payer: Self-pay | Admitting: Family Medicine

## 2016-01-05 VITALS — BP 147/82 | HR 72 | Temp 98.2°F | Resp 13 | Ht 72.0 in | Wt 251.0 lb

## 2016-01-05 DIAGNOSIS — F410 Panic disorder [episodic paroxysmal anxiety] without agoraphobia: Secondary | ICD-10-CM | POA: Diagnosis present

## 2016-01-05 DIAGNOSIS — F4010 Social phobia, unspecified: Secondary | ICD-10-CM | POA: Insufficient documentation

## 2016-01-05 DIAGNOSIS — Z131 Encounter for screening for diabetes mellitus: Secondary | ICD-10-CM | POA: Insufficient documentation

## 2016-01-05 DIAGNOSIS — J029 Acute pharyngitis, unspecified: Secondary | ICD-10-CM | POA: Insufficient documentation

## 2016-01-05 DIAGNOSIS — F411 Generalized anxiety disorder: Secondary | ICD-10-CM | POA: Insufficient documentation

## 2016-01-05 DIAGNOSIS — G4700 Insomnia, unspecified: Secondary | ICD-10-CM

## 2016-01-05 LAB — POCT GLYCOSYLATED HEMOGLOBIN (HGB A1C): HEMOGLOBIN A1C: 5.9

## 2016-01-05 MED ORDER — BUSPIRONE HCL 5 MG PO TABS: 5.0000 mg | ORAL_TABLET | Freq: Three times a day (TID) | ORAL | Status: DC

## 2016-01-05 MED ORDER — TRAZODONE HCL 100 MG PO TABS: 100.0000 mg | ORAL_TABLET | Freq: Every evening | ORAL | Status: DC | PRN

## 2016-01-05 NOTE — Progress Notes (Signed)
Subjective:  Patient ID: Brandon Hartman, male    DOB: Jan 24, 1986  Age: 30 y.o. MRN: [id number]  CC: Panic Attack   HPI Brandon Hartman presents for a follow-up on anxiety and insomnia. He had complained of anxiety and panic attacks for which he was placed on hydroxyzine and advised to report to [facility] for treatment but he never did due to lack of time he says. He complains that hydroxyzine is not helping and he has had some panic attacks since his last visit with episodes occurring mostly in the night around 1 AM and usually have no triggers with palpitations and difficulty breathing. No chest pains. 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  He has also had a sore throat with used to drink loss but no sinus pressure and took his sons amoxicillin for 2 days.  Outpatient Prescriptions Prior to Visit  Medication Sig Dispense Refill  . omeprazole (PRILOSEC OTC) 20 MG tablet Take 20 mg by mouth daily.    . hydrOXYzine (VISTARIL) 25 MG capsule Take 4 capsules (100 mg total) by mouth 3 (three) times daily as needed for itching. 90 capsule 0  . traZODone (DESYREL) 50 MG tablet Take 1 tablet (50 mg total) by mouth at bedtime as needed for sleep. 30 tablet 0   No facility-administered medications prior to visit.    ROS Review of Systems  Constitutional: Negative for activity change and appetite change.  HENT: Positive for sore throat. Negative for sinus pressure.   Respiratory: Negative for chest tightness, shortness of breath and wheezing.   Cardiovascular: Negative for chest pain and palpitations.  Gastrointestinal: Negative for abdominal pain, constipation and abdominal distention.  Genitourinary: Negative.   Musculoskeletal: Negative.   Psychiatric/Behavioral: Negative for behavioral problems and dysphoric mood. The patient is nervous/anxious.      Objective:  BP 147/82 mmHg  Pulse 72  Temp(Src) 98.2 F (36.8 C)  Resp 13  Ht 6' (1.829 m)  Wt 251 lb (113.853 kg)  BMI 34.03 kg/m2  SpO2 96%  BP/Weight 01/05/2016 11/30/2015 09/16/2015  Systolic BP 147 133 124  Diastolic BP 82 85 72  Wt. (Lbs) 251 246 -  BMI 34.03 33.36 -      Physical Exam  Constitutional: He is oriented to person, place, and time. He appears well-developed and well-nourished.  Cardiovascular: Normal rate, normal heart sounds and intact distal pulses.   No murmur heard. Pulmonary/Chest: Effort normal and breath sounds normal. He has no wheezes. He has no rales. He exhibits no tenderness.  Abdominal: Soft. Bowel sounds are normal. He exhibits no distension and no mass. There is no tenderness.  Musculoskeletal: Normal range of motion.  Neurological: He is alert and oriented to person, place, and time.     Assessment & Plan:   1. Diabetes mellitus screening A1c is 5.9 - HgB A1c  2. Anxiety state Uncontrolled on hydroxyzine which I have now discontinued. I have explained to him I will be unable to place him on short acting benzodiazepines and if he does not do well on trial of BuSpar he will have to make his appointment with [facility] which he had been advised to do previously - busPIRone (BUSPAR) 5 MG tablet; Take 1 tablet (5 mg total) by mouth 3 (three) times daily.  Dispense: 90 tablet; Refill: 1  3. Insomnia Increase dose  of trazodone as insomnia with uncontrolled on 50 mg - traZODone (DESYREL) 100 MG tablet; Take 1 tablet (100 mg total) by mouth at bedtime as needed for sleep.  Dispense: 30 tablet; Refill: 1  4. Sorethroat Likely from postnasal drainage but we'll send a throat culture - Culture, Grp B Strep w/Rflx Suscept   Meds ordered this encounter  Medications  . traZODone (DESYREL) 100 MG tablet    Sig: Take 1 tablet (100 mg total) by mouth at bedtime as needed for sleep.    Dispense:  30 tablet    Refill:  1    Discontinue previous  dose  . busPIRone (BUSPAR) 5 MG tablet    Sig: Take 1 tablet (5 mg total) by mouth 3 (three) times daily.    Dispense:  90 tablet    Refill:  1    Follow-up: Return in about 1 month (around 02/05/2016) for  follow-up of anxiety.Jaclyn Shaggy MD

## 2016-01-05 NOTE — Patient Instructions (Signed)
Generalized Anxiety Disorder Generalized anxiety disorder (GAD) is a mental disorder. It interferes with life functions, including relationships, work, and school. GAD is different from normal anxiety, which everyone experiences at some point in their lives in response to specific life events and activities. Normal anxiety actually helps us prepare for and get through these life events and activities. Normal anxiety goes away after the event or activity is over.  GAD causes anxiety that is not necessarily related to specific events or activities. It also causes excess anxiety in proportion to specific events or activities. The anxiety associated with GAD is also difficult to control. GAD can vary from mild to severe. People with severe GAD can have intense waves of anxiety with physical symptoms (panic attacks).  SYMPTOMS The anxiety and worry associated with GAD are difficult to control. This anxiety and worry are related to many life events and activities and also occur more days than not for 6 months or longer. People with GAD also have three or more of the following symptoms (one or more in children):  Restlessness.   Fatigue.  Difficulty concentrating.   Irritability.  Muscle tension.  Difficulty sleeping or unsatisfying sleep. DIAGNOSIS GAD is diagnosed through an assessment by your health care provider. Your health care provider will ask you questions aboutyour mood,physical symptoms, and events in your life. Your health care provider may ask you about your medical history and use of alcohol or drugs, including prescription medicines. Your health care provider may also do a physical exam and blood tests. Certain medical conditions and the use of certain substances can cause symptoms similar to those associated with GAD. Your health care provider may refer you to a mental health specialist for further evaluation. TREATMENT The following therapies are usually used to treat GAD:    Medication. Antidepressant medication usually is prescribed for long-term daily control. Antianxiety medicines may be added in severe cases, especially when panic attacks occur.   Talk therapy (psychotherapy). Certain types of talk therapy can be helpful in treating GAD by providing support, education, and guidance. A form of talk therapy called cognitive behavioral therapy can teach you healthy ways to think about and react to daily life events and activities.  Stress managementtechniques. These include yoga, meditation, and exercise and can be very helpful when they are practiced regularly. A mental health specialist can help determine which treatment is best for you. Some people see improvement with one therapy. However, other people require a combination of therapies.   This information is not intended to replace advice given to you by your health care provider. Make sure you discuss any questions you have with your health care provider.   Document Released: 03/31/2013 Document Revised: 12/25/2014 Document Reviewed: 03/31/2013 Elsevier Interactive Patient Education 2016 Elsevier Inc.  

## 2016-01-05 NOTE — Progress Notes (Signed)
Patient was instructed to go to [facility] but has not gone yet.  He states he will try and go next week He reports panic attacks two per week and medication is not helping He also complains of sore throat and he states " I took a couple of antibiotics from my son because I don't want to get strep."

## 2016-01-07 ENCOUNTER — Telehealth: Payer: Self-pay

## 2016-01-07 LAB — CULTURE, GROUP A STREP: ORGANISM ID, BACTERIA: NORMAL

## 2016-01-07 NOTE — Telephone Encounter (Signed)
Spoke with valerie at [employer] [facility] They called to clarify order Should run throat culture

## 2016-02-04 ENCOUNTER — Encounter: Payer: Self-pay | Admitting: Family Medicine

## 2016-02-04 ENCOUNTER — Ambulatory Visit: Payer: Medicaid Other | Attending: Family Medicine | Admitting: Family Medicine

## 2016-02-04 ENCOUNTER — Ambulatory Visit (HOSPITAL_BASED_OUTPATIENT_CLINIC_OR_DEPARTMENT_OTHER): Payer: Medicaid Other | Admitting: Clinical

## 2016-02-04 VITALS — BP 132/86 | HR 74 | Temp 97.7°F | Resp 15 | Ht 72.0 in | Wt 251.4 lb

## 2016-02-04 DIAGNOSIS — F411 Generalized anxiety disorder: Secondary | ICD-10-CM

## 2016-02-04 DIAGNOSIS — G4700 Insomnia, unspecified: Secondary | ICD-10-CM | POA: Insufficient documentation

## 2016-02-04 MED ORDER — HYDROXYZINE HCL 25 MG PO TABS: 25.0000 mg | ORAL_TABLET | Freq: Three times a day (TID) | ORAL | Status: DC | PRN

## 2016-02-04 NOTE — Progress Notes (Signed)
ASSESSMENT: Pt currently experiencing symptoms of anxiety, needs to f/u with PCP and [facility], as well as establish with psychiatry for [facility] med management. Pt would benefit from psychoeducation; may benefit from brief therapeutic interventions in future visits, to cope with symptoms of anxiety.  Stage of Change: precontemplative  PLAN: 1. F/U with behavioral health consultant in two weeks 2. Psychiatric Medications: Atarax. 3. Behavioral recommendation(s):   -Call either [facility] at [phone] or Dr. Jannifer Franklin at [phone] for initial psychiatry appointment -Consider reading educational material regarding coping with symptoms of anxiety  SUBJECTIVE: Pt. referred by Dr Venetia Night for symptoms of anxiety:  Pt. reports the following symptoms/concerns: Pt states that he has been feeling an increase in anxious feelings, thinks he had at least one panic attack; nothing has helped him to feel less anxious except for medication he was given by friend, but does not recall name of medication. Pt primary concern today is getting medication to make anxiety stop. Pt denies any depression, says he has "never been depressed".  Duration of problem: At least three months Severity: severe  OBJECTIVE: Orientation & Cognition: Oriented x3. Thought processes normal and appropriate to situation. Mood: appropriate. Affect: appropriate Appearance: jittery Risk of harm to self or others: no known risk of harm to self or others Substance use: alcohol, tobacco Assessments administered: PHQ9: 0/ GAD7: 21  Diagnosis: Anxiety CPT Code: F41.1 -------------------------------------------- Other(s) present in the room: none  Time spent with patient in exam room: 16 minutes,    Depression screen [facility] 2/9 02/04/2016 01/05/2016 11/30/2015 11/30/2015  Decreased Interest 0 Down, Depressed, Hopeless 0 0 1 3  PHQ - 2 Score 0 Altered sleeping - 2 3 -  Tired, decreased energy - 1 2 -   Change in appetite - 0 2 -  Feeling bad or failure about yourself  - 0 1 -  Trouble concentrating - 1 2 -  Moving slowly or fidgety/restless - 0 2 -  Suicidal thoughts - 0 0 -  PHQ-9 Score - 5 14 -   GAD 7 : Generalized Anxiety Score 02/04/2016 01/05/2016 11/30/2015  Nervous, Anxious, on Edge Control/stop worrying Worry too much - different things Trouble relaxing Restless Easily annoyed or irritable Afraid - awful might happen Total GAD 7 Score 21 15 20

## 2016-02-04 NOTE — Progress Notes (Signed)
Subjective:  Patient ID: Brandon Hartman, male    DOB: 1986-11-12  Age: 30 y.o. MRN: [id number]  CC: Follow-up   HPI Brandon Hartman is a 30 year old male with a history of insomnia and anxiety who comes in for a follow up on anxiety.  He was placed on Buspar at his last visit which he states is not working; he continues to have panic attacks with racing heart and rapid breathing and he states he received a xanax pill from his mom which helped. He states hydroxyzine which he used prior to Buspar helped a bit. He has been unable to make a visit to [facility] due to his work schedule.  He states the increased dose of Trazodone helps his insomnia but gives him weird dreams.  Outpatient Prescriptions Prior to Visit  Medication Sig Dispense Refill  . omeprazole (PRILOSEC OTC) 20 MG tablet Take 20 mg by mouth daily.    . traZODone (DESYREL) 100 MG tablet Take 1 tablet (100 mg total) by mouth at bedtime as needed for sleep. 30 tablet 1  . busPIRone (BUSPAR) 5 MG tablet Take 1 tablet (5 mg total) by mouth 3 (three) times daily. (Patient not taking: Reported on 02/04/2016) 90 tablet 1   No facility-administered medications prior to visit.    ROS Review of Systems  Constitutional: Negative for activity change and appetite change.  HENT: Negative for sinus pressure and sore throat.   Respiratory: Negative for chest tightness, shortness of breath and wheezing.   Cardiovascular: Negative for chest pain and palpitations.  Gastrointestinal: Negative for abdominal pain, constipation and abdominal distention.  Genitourinary: Negative.   Musculoskeletal: Negative.   Psychiatric/Behavioral: Negative for behavioral problems and dysphoric mood. The patient is not nervous/anxious.     Objective:  BP 132/86 mmHg  Pulse 74  Temp(Src) 97.7 F (36.5 C)  Resp 15  Ht 6' (1.829 m)  Wt 251 lb 6.4 oz (114.034 kg)  BMI 34.09 kg/m2  SpO2 95%  BP/Weight 02/04/2016 01/05/2016 11/30/2015  Systolic BP 132 147  133  Diastolic BP 86 82 85  Wt. (Lbs) 251.4 251 246  BMI 34.09 34.03 33.36      Physical Exam  Constitutional: He is oriented to person, place, and time. He appears well-developed and well-nourished.  Cardiovascular: Normal rate, normal heart sounds and intact distal pulses.   No murmur heard. Pulmonary/Chest: Effort normal and breath sounds normal. He has no wheezes. He has no rales. He exhibits no tenderness.  Abdominal: Soft. Bowel sounds are normal. He exhibits no distension and no mass. There is no tenderness.  Musculoskeletal: Normal range of motion.  Neurological: He is alert and oriented to person, place, and time.     Assessment & Plan:   1. Anxiety state BuSpar not working and the patient occasionally has panic attacks. LCSW called in to see the patient. Referred to psychiatry - hydrOXYzine (ATARAX/VISTARIL) 25 MG tablet; Take 1 tablet (25 mg total) by mouth 3 (three) times daily as needed.  Dispense: 90 tablet; Refill: 0  2. Insomnia Controlled on trazodone even though the patient says it gives him where dreams however he chooses to remain on it.   Meds ordered this encounter  Medications  . hydrOXYzine (ATARAX/VISTARIL) 25 MG tablet    Sig: Take 1 tablet (25 mg total) by mouth 3 (three) times daily as needed.    Dispense:  90 tablet    Refill:  0    Follow-up: Return in about 3 months (around  05/03/2016) for Follow-up on insomnia.   Jaclyn Shaggy MD

## 2016-02-04 NOTE — Patient Instructions (Signed)
Generalized Anxiety Disorder Generalized anxiety disorder (GAD) is a mental disorder. It interferes with life functions, including relationships, work, and school. GAD is different from normal anxiety, which everyone experiences at some point in their lives in response to specific life events and activities. Normal anxiety actually helps us prepare for and get through these life events and activities. Normal anxiety goes away after the event or activity is over.  GAD causes anxiety that is not necessarily related to specific events or activities. It also causes excess anxiety in proportion to specific events or activities. The anxiety associated with GAD is also difficult to control. GAD can vary from mild to severe. People with severe GAD can have intense waves of anxiety with physical symptoms (panic attacks).  SYMPTOMS The anxiety and worry associated with GAD are difficult to control. This anxiety and worry are related to many life events and activities and also occur more days than not for 6 months or longer. People with GAD also have three or more of the following symptoms (one or more in children):  Restlessness.   Fatigue.  Difficulty concentrating.   Irritability.  Muscle tension.  Difficulty sleeping or unsatisfying sleep. DIAGNOSIS GAD is diagnosed through an assessment by your health care provider. Your health care provider will ask you questions aboutyour mood,physical symptoms, and events in your life. Your health care provider may ask you about your medical history and use of alcohol or drugs, including prescription medicines. Your health care provider may also do a physical exam and blood tests. Certain medical conditions and the use of certain substances can cause symptoms similar to those associated with GAD. Your health care provider may refer you to a mental health specialist for further evaluation. TREATMENT The following therapies are usually used to treat GAD:    Medication. Antidepressant medication usually is prescribed for long-term daily control. Antianxiety medicines may be added in severe cases, especially when panic attacks occur.   Talk therapy (psychotherapy). Certain types of talk therapy can be helpful in treating GAD by providing support, education, and guidance. A form of talk therapy called cognitive behavioral therapy can teach you healthy ways to think about and react to daily life events and activities.  Stress managementtechniques. These include yoga, meditation, and exercise and can be very helpful when they are practiced regularly. A mental health specialist can help determine which treatment is best for you. Some people see improvement with one therapy. However, other people require a combination of therapies.   This information is not intended to replace advice given to you by your health care provider. Make sure you discuss any questions you have with your health care provider.   Document Released: 03/31/2013 Document Revised: 12/25/2014 Document Reviewed: 03/31/2013 Elsevier Interactive Patient Education 2016 Elsevier Inc.  

## 2016-02-04 NOTE — Progress Notes (Signed)
Patient states Buspar is helping less that the vistaril His mother gave him one of her anxiety pills and it helped-he is unsure what it was He does not have time to go to [facility]

## 2016-03-22 ENCOUNTER — Telehealth: Payer: Self-pay | Admitting: Clinical

## 2016-03-22 NOTE — Telephone Encounter (Signed)
Pt called, saying he needs medical advice about "a bad cold, when I cough, it feels like my chest burns, and I don't really want to go to the ED". Please f/u call at [phone] to advise.

## 2016-03-22 NOTE — Telephone Encounter (Signed)
Spoke to patient and advised h im to drink lots of fluids, rest and try not to smoke cigarettes.  Explained it could take a week to 10 days before he felt better and if he developed a fever or felt she was having trouble breathing to call back (gave him my direct number).  Patient verbalized understanding and was appreciative.

## 2016-03-30 ENCOUNTER — Telehealth: Payer: Self-pay | Admitting: Clinical

## 2016-03-30 NOTE — Telephone Encounter (Signed)
Attempt to f/u with pt, voicemail says "hello, it's [name]", did not leave message

## 2016-08-01 ENCOUNTER — Emergency Department (HOSPITAL_COMMUNITY)
Admission: EM | Admit: 2016-08-01 | Discharge: 2016-08-01 | Disposition: A | Discharge status: Home / Self Care | Payer: Medicaid Other | Attending: Emergency Medicine | Admitting: Emergency Medicine

## 2016-08-01 ENCOUNTER — Encounter (HOSPITAL_COMMUNITY): Payer: Self-pay | Admitting: Emergency Medicine

## 2016-08-01 DIAGNOSIS — M542 Cervicalgia: Secondary | ICD-10-CM | POA: Diagnosis not present

## 2016-08-01 DIAGNOSIS — F410 Panic disorder [episodic paroxysmal anxiety] without agoraphobia: Secondary | ICD-10-CM | POA: Diagnosis not present

## 2016-08-01 DIAGNOSIS — F17210 Nicotine dependence, cigarettes, uncomplicated: Secondary | ICD-10-CM | POA: Insufficient documentation

## 2016-08-01 DIAGNOSIS — Y939 Activity, unspecified: Secondary | ICD-10-CM | POA: Diagnosis not present

## 2016-08-01 DIAGNOSIS — F909 Attention-deficit hyperactivity disorder, unspecified type: Secondary | ICD-10-CM | POA: Insufficient documentation

## 2016-08-01 DIAGNOSIS — Y999 Unspecified external cause status: Secondary | ICD-10-CM | POA: Diagnosis not present

## 2016-08-01 DIAGNOSIS — Y92002 Bathroom of unspecified non-institutional (private) residence single-family (private) house as the place of occurrence of the external cause: Secondary | ICD-10-CM | POA: Diagnosis not present

## 2016-08-01 DIAGNOSIS — R55 Syncope and collapse: Secondary | ICD-10-CM | POA: Insufficient documentation

## 2016-08-01 DIAGNOSIS — W19XXXA Unspecified fall, initial encounter: Secondary | ICD-10-CM | POA: Insufficient documentation

## 2016-08-01 HISTORY — DX: Panic disorder (episodic paroxysmal anxiety): F41.0

## 2016-08-01 LAB — COMPREHENSIVE METABOLIC PANEL
ALBUMIN: 4.1 g/dL (ref 3.5–5.0)
ALT: 30 U/L (ref 17–63)
AST: 29 U/L (ref 15–41)
Alkaline Phosphatase: 68 U/L (ref 38–126)
Anion gap: 8 (ref 5–15)
BUN: 7 mg/dL (ref 6–20)
CALCIUM: 9.4 mg/dL (ref 8.9–10.3)
CHLORIDE: 106 mmol/L (ref 101–111)
CO2: 22 mmol/L (ref 22–32)
Creatinine, Ser: 0.95 mg/dL (ref 0.61–1.24)
GFR calc Af Amer: >60 mL/min (ref >60)
Glucose, Bld: 106 mg/dL — ABNORMAL HIGH (ref 65–99)
Potassium: 3.9 mmol/L (ref 3.5–5.1)
SODIUM: 136 mmol/L (ref 135–145)
TOTAL PROTEIN: 7.6 g/dL (ref 6.5–8.1)
Total Bilirubin: 0.9 mg/dL (ref 0.3–1.2)

## 2016-08-01 LAB — CBC WITH DIFFERENTIAL/PLATELET
BASOS ABS: 0 10*3/uL (ref 0.0–0.1)
Basophils Relative: 0 %
EOS PCT: 1 %
Eosinophils Absolute: 0.1 10*3/uL (ref 0.0–0.7)
HCT: 48.3 % (ref 39.0–52.0)
Hemoglobin: 16.3 g/dL (ref 13.0–17.0)
LYMPHS PCT: 20 %
Lymphs Abs: 2.6 10*3/uL (ref 0.7–4.0)
MCH: 31.7 pg (ref 26.0–34.0)
MCHC: 33.7 g/dL (ref 30.0–36.0)
MCV: 94 fL (ref 78.0–100.0)
Monocytes Absolute: 0.8 10*3/uL (ref 0.1–1.0)
Monocytes Relative: 6 %
NEUTROS ABS: 9.5 10*3/uL — AB (ref 1.7–7.7)
Neutrophils Relative %: 73 %
PLATELETS: 337 10*3/uL (ref 150–400)
RBC: 5.14 MIL/uL (ref 4.22–5.81)
RDW: 14.2 % (ref 11.5–15.5)
WBC: 13 10*3/uL — AB (ref 4.0–10.5)

## 2016-08-01 MED ORDER — HYDROXYZINE HCL 10 MG PO TABS: 10.0000 mg | ORAL_TABLET | Freq: Four times a day (QID) | ORAL | 0 refills | Status: DC | PRN

## 2016-08-01 NOTE — ED Triage Notes (Signed)
Pt to ED via [facility] after having a syncopal episode.  Pt st's he thinks he was having a panic attack.  St's when he fell he injured his neck.  On arrival to ED pain in a hard c-collar.

## 2016-08-01 NOTE — ED Provider Notes (Signed)
[facility] Provider Note   CSN: [id number] Arrival date & time: 08/01/16  1503     History   Chief Complaint Chief Complaint  Patient presents with  . Loss of Consciousness    HPI Brandon Hartman is a 30 y.o. male.  Brandon Hartman is a 30 y.o. male who presents to the emergency department after a syncopal episode earlier today. Patient reports he was standing up in his bathroom when he began feeling anxious about his job and work. He reports he felt like he was having a panic attack. He has a history of panic attacks.  He reports he began hyperventilating and then passed out falling to the ground. He denies having any chest pain or shortness of breath prior to his fall. He complains of a mild generalized headache currently. He reports he believes he hit his head on the bathroom floor. He denies any focal pain. He does complain of some left lateral neck pain that is worse with movement. Patient reports he's been feeling very anxious recently due to stress at his work and at home. He has a history of anxiety. He also has a history of panic attacks. He has had panic attacks previously. He also reports he has had nothing to eat or drink today. Patient denies fevers, chest pain, shortness of breath, coughing, abdominal pain, nausea, vomiting, diarrhea, rashes, numbness, tingling, weakness, double vision.    The history is provided by the patient. No language interpreter was used.    Past Medical History:  Diagnosis Date  . ADHD (attention deficit hyperactivity disorder)   . Anxiety attack     Patient Active Problem List   Diagnosis Date Noted  . Anxiety state 11/30/2015  . Tobacco use disorder 11/30/2015  . Insomnia 11/30/2015    Past Surgical History:  Procedure Laterality Date  . MOUTH SURGERY         Home Medications    Prior to Admission medications   Medication Sig Start Date End Date Taking? Authorizing Provider  hydrOXYzine (ATARAX/VISTARIL) 10 MG tablet  Take 1 tablet (10 mg total) by mouth every 6 (six) hours as needed for anxiety. 08/01/16   Everlene Farrier, PA-C  omeprazole (PRILOSEC OTC) 20 MG tablet Take 20 mg by mouth daily.    Historical Provider, MD  traZODone (DESYREL) 100 MG tablet Take 1 tablet (100 mg total) by mouth at bedtime as needed for sleep. 01/05/16   Jaclyn Shaggy, MD    Family History No family history on file.  Social History Social History  Substance Use Topics  . Smoking status: Current Every Day Smoker    Packs/day: 1.00    Types: Cigarettes  . Smokeless tobacco: Never Used  . Alcohol use 3.6 oz/week    6 Cans of beer per week     Allergies   Review of patient's allergies indicates no known allergies.   Review of Systems Review of Systems  Constitutional: Negative for chills and fever.  HENT: Negative for congestion, nosebleeds and sore throat.   Eyes: Negative for photophobia, pain and visual disturbance.  Respiratory: Negative for cough, shortness of breath and wheezing.   Cardiovascular: Negative for chest pain, palpitations and leg swelling.  Gastrointestinal: Negative for abdominal pain, diarrhea, nausea and vomiting.  Genitourinary: Negative for dysuria.  Musculoskeletal: Positive for neck pain. Negative for back pain and neck stiffness.  Skin: Negative for rash.  Neurological: Positive for syncope and headaches. Negative for dizziness, seizures, speech difficulty, weakness, light-headedness and numbness.  Physical Exam Updated Vital Signs BP 125/81   Pulse 62   Temp 98.3 F (36.8 C) (Oral)   Resp 21   Ht 5\' 11"  (1.803 m)   Wt 108.9 kg   SpO2 99%   BMI 33.47 kg/m   Physical Exam  Constitutional: He is oriented to person, place, and time. He appears well-developed and well-nourished. No distress.  Nontoxic appearing.  HENT:  Head: Normocephalic and atraumatic.  Right Ear: External ear normal.  Left Ear: External ear normal.  Mouth/Throat: Oropharynx is clear and moist.  No  visible signs of head injury.  Eyes: Conjunctivae and EOM are normal. Pupils are equal, round, and reactive to light. Right eye exhibits no discharge. Left eye exhibits no discharge.  Neck: Normal range of motion. Neck supple. No JVD present. No tracheal deviation present.  Patient has mild tenderness over his left lateral neck musculature across his left trapezius muscle. No midline neck tenderness. No neck crepitus, deformity or ecchymosis.  Cardiovascular: Normal rate, regular rhythm, normal heart sounds and intact distal pulses.  Exam reveals no gallop and no friction rub.   No murmur heard. Pulmonary/Chest: Effort normal and breath sounds normal. No stridor. No respiratory distress. He has no wheezes. He has no rales.  Lungs clear to auscultation bilaterally.  Abdominal: Soft. There is no tenderness. There is no guarding.  Abdomen is soft and nontender to palpation.  Musculoskeletal: Normal range of motion. He exhibits no edema, tenderness or deformity.  No midline back tenderness. No back erythema, deformity, ecchymosis or warmth. Good range of motion of his bilateral upper and lower extremities. No clavicle tenderness bilaterally. Good strength to his bilateral upper and lower extremities.  Lymphadenopathy:    He has no cervical adenopathy.  Neurological: He is alert and oriented to person, place, and time. No cranial nerve deficit. Coordination normal.  Patient is alert and oriented 3. Cranial nerves are intact. Speech is clear and coherent. EOMs are intact. Sensation is intact in his bilateral upper and lower extremities. Normal gait. Normal finger to nose. No pronator drift.   Skin: Skin is warm and dry. Capillary refill takes less than 2 seconds. No rash noted. He is not diaphoretic. No erythema. No pallor.  Psychiatric: His behavior is normal. His mood appears anxious. He expresses no homicidal and no suicidal ideation.  Patient appears anxious.   Nursing note and vitals  reviewed.    ED Treatments / Results  Labs (all labs ordered are listed, but only abnormal results are displayed) Labs Reviewed  CBC WITH DIFFERENTIAL/PLATELET - Abnormal; Notable for the following:       Result Value   WBC 13.0 (*)    Neutro Abs 9.5 (*)    All other components within normal limits  COMPREHENSIVE METABOLIC PANEL - Abnormal; Notable for the following:    Glucose, Bld 106 (*)    All other components within normal limits    EKG  EKG Interpretation None       Radiology No results found.  Procedures Procedures (including critical care time)  Medications Ordered in ED Medications - No data to display   Initial Impression / Assessment and Plan / ED Course  I have reviewed the triage vital signs and the nursing notes.  Pertinent labs & imaging results that were available during my care of the patient were reviewed by me and considered in my medical decision making (see chart for details).  Clinical Course   This  is a 30 y.o. male who  presents to the emergency department after a syncopal episode earlier today. Patient reports he was standing up in his bathroom when he began feeling anxious about his job and work. He reports he felt like he was having a panic attack. He has a history of panic attacks.  He reports he began hyperventilating and then passed out falling to the ground. He denies having any chest pain or shortness of breath prior to his fall. He complains of a mild generalized headache currently. He reports he believes he hit his head on the bathroom floor. He denies any focal pain. He does complain of some left lateral neck pain that is worse with movement. Patient reports he's been feeling very anxious recently due to stress at his work and at home. He has a history of anxiety. He also has a history of panic attacks. He has had panic attacks previously. He also reports he has had nothing to eat or drink today. Patient denies fevers, chest pain, shortness  of breath. On exam the patient is afebrile nontoxic appearing. He has no focal neurological deficits. EKG shows no STEMI. No changes from his last tracing.  He has normal gait. CBC is a right groin for mild leukocytosis with a white count of 13,000. He denies any fevers or infectious symptoms. CMP is unremarkable. Patient appears to have had a panic attack where he hyperventilated and had a syncopal episode. He reports feeling back to baseline currently. He had normal orthostatic vital signs. Will discharge patient with prescription for Atarax for anxiety as needed and provided with information for behavioral health resources for follow-up. I encouraged him to follow-up with his primary care doctor and to seek referral to therapy for his anxiety. I also discussed her specific return precautions. I advised the patient to follow-up with their primary care provider this week. I advised the patient to return to the emergency department with new or worsening symptoms or new concerns. The patient verbalized understanding and agreement with plan.        Final Clinical Impressions(s) / ED Diagnoses   Final diagnoses:  Panic attack  Syncope and collapse    New Prescriptions New Prescriptions   HYDROXYZINE (ATARAX/VISTARIL) 10 MG TABLET    Take 1 tablet (10 mg total) by mouth every 6 (six) hours as needed for anxiety.     Everlene FarrierWilliam Keyden Pavlov, PA-C 08/01/16 2033    Arby BarretteMarcy Pfeiffer, MD 09/05/16 [phone]

## 2016-08-01 NOTE — Discharge Instructions (Signed)
Substance Abuse Treatment Programs ° °Intensive Outpatient Programs °[facility]     °[address]      °[place], [place]                   °[phone]      ° °[facility] °[address] °[place], [place] °[phone] ° °[facility]     °(Inpatient and outpatient)     °[address]           °[phone]   ° °[facility] °[phone] (Suboxone and Methadone) ° °[address]      °[place], [place] [address]      °[phone]      ° °[address] °[place], [place] °[phone] ° °[facility] (Outpatient/Inpatient, Chemical)    °(insurance only) [phone]      °       °[facility] (Groups & Residential) °[place], [place] °[phone] ° °   °[facility]     °[address]     °[place], [place]      °[phone]      ° °[facility] (for caregivers and family) °[address] °[place], [place] °336-299-4655 ° ° ° ° ° °Residential Treatment Programs °[facility]      °[address], [place], [place] [address]  °([phone]      ° °[facility] °[address] [place], [place] [address] °[phone] ° °[facility]        °[phone]      ° °[facility] °1-800-659-3381 ° °[facility]             °[address]                                         °[place], [place]                                                °877-615-2722 or 336-784-9470                              ° °[facility] [place] °[address] °[place], [address] °[phone] ° °[facility]    °[address]      °[place], [place]     °[phone]      ° °[facility] °[address] °, [place] °336-285-9073 ° °[facility]   °[address]     °[place], [place] [address]     °[phone]      °Admissions: 8am-3pm M-F ° °[facility] °[address] °[place],  [place] °336-227-7417 ° °[facility]: Residential Program (90 Days)   °[place], [place]      °336-725-8389 or 800-758-6077    ° °[facility] °[place], [place] °(Walk in Hours over the weekend or by referral) ° °[facility] °[address], [place], [place] [address] °([phone] ° °Crisis Mobile: [facility]:  1-877-626-1772 (for crisis response 24 hours a day) °[facility] Hotline:      1-800-256-2452 °Outpatient Psychiatry and Counseling ° °[facility]: Mobile Crisis   Management 24 hours:  1-877-626-1772 ° °[facility] sliding scale fee and walk in schedule: M-F 8am-12pm/1pm-3pm °[address]  °[place], [place] [address] °[phone] ° °[facility] °[address] °[place], [place] [address] °[phone] ° °[facility] ([facility])- new patient walk-in appointments available Monday - Friday 8am -3pm.          °[address] °[place], [place] [address] °336-676-6840 or crisis line- 336-676-6905 ° °[facility] Intensive Outpatient Therapy Program °[address] °[place], [place] [address] °336-832-9804 ° °[facility]                  °Crisis Services      °[phone]      °[address]     °[place], [place] [address]                ° °[facility]   °[facility] °[phone] °[address] °[place], [place] [address] ° ° °[facility]          °[address]  °[place], [place] [address]       °([phone] ° °[facility] °[address] °[place], [place] [address] °336-292-1510 ° °[facility]    °[address]    °[place], [place] [address]     °[phone]      ° °Parish McKinney, MD     °[address]     °[place] [address]     °336-282-1251     °  °[facility] °[address] °[place] [address] ° °[facility]     °[address]     °[place], [place]      °336-542-2076      ° °[facility] °Shamsher Ahluwalia, MD °[address] °[place], [place] [address] °[phone] ° °[facility]     °[address]     °[place], [place] [address]     °336-274-1237      ° °[facility] °[address] °[place], [place] [address] °336-854-4450 °Resources for Temporary Residential Assistance/Crisis Centers ° °DAY CENTERS °[facility] °M-F 8am-3pm   °[address] [place], [place] [address]   336-332-0824 °Services include: laundry, barbering, support groups, case management, phone  & computer access, showers, AA/NA mtgs, mental health/substance abuse nurse, job skills class, disability information, VA assistance, spiritual classes, etc.  ° °HOMELESS SHELTERS ° °[facility]     °[facility]   °[address], [place]     °[phone]       °       °[facility] (women and children)       °[address] °[place], [place] [address] °336-275-0820 °[email or web address] for application and process °Application Required ° °[facility]   °[address]    °[place] [address]     °[phone]       °             °[facility] °[address] °[place],  [address] °336.273.5572 °336-235-0363(schedule application appt.) °Application Required ° °[facility] (women only)    °[address]     °[place],  [address]     °336-884-1039      °  Intake starts 6pm daily °Need valid ID, SSC, & Police report °[facility] [place] °[address] °[place], [place] °336-881-5420 °Application Required ° °[facility] (men only)     °[address]      °[place], [place]     °[phone]      ° °[facility] °(Pregnant women only) °[address] °[place], [place] °336-275-0206 ° °[facility]      °[address]      °[place], [place] [address]     °[phone]      °       °[facility] °[address] °[place], [place] °336-723-1848 °90 day commitment/SA/Application process ° °[facility](men only)     °[address]     °[place], [place]     °[phone]       °Check-in at 7pm     °       °[facility] °[address] °[place], [place] [address] °336-248-6684 °Men/Women/Women and Children must be there by 7 pm ° °[facility] °[place], [place] °336-722-8721                ° °

## 2016-10-18 ENCOUNTER — Encounter (HOSPITAL_COMMUNITY): Payer: Self-pay | Admitting: *Deleted

## 2016-10-18 ENCOUNTER — Ambulatory Visit (HOSPITAL_COMMUNITY)
Admission: EM | Admit: 2016-10-18 | Discharge: 2016-10-18 | Disposition: A | Discharge status: Home / Self Care | Payer: Medicaid Other | Attending: Emergency Medicine | Admitting: Emergency Medicine

## 2016-10-18 DIAGNOSIS — M7581 Other shoulder lesions, right shoulder: Secondary | ICD-10-CM | POA: Diagnosis not present

## 2016-10-18 DIAGNOSIS — M7521 Bicipital tendinitis, right shoulder: Secondary | ICD-10-CM | POA: Diagnosis not present

## 2016-10-18 MED ORDER — HYDROCODONE-ACETAMINOPHEN 5-325 MG PO TABS: 1.0000 | ORAL_TABLET | Freq: Four times a day (QID) | ORAL | 0 refills | Status: DC | PRN

## 2016-10-18 MED ORDER — DICLOFENAC POTASSIUM 50 MG PO TABS: 50.0000 mg | ORAL_TABLET | Freq: Three times a day (TID) | ORAL | 0 refills | Status: DC

## 2016-10-18 NOTE — Discharge Instructions (Signed)
You have irritated several of the tendons in your arm. This includes the rotator cuff, biceps, and pronator. Take diclofenac 3 times a day for the next week. Do not take ibuprofen, Aleve, or Advil with this medicine. Use the hydrocodone every 6 hours as needed for severe pain. Do not drive for taking this medicine. Apply ice to the painful areas for 20 minutes at least 3 times a day. Rest your arm as much as possible. No lifting over 10 pounds. If this is not improving in 1 week, please make an appointment with the sports medicine center.

## 2016-10-18 NOTE — ED Triage Notes (Signed)
Was  Arm  Wrestling        2  1/2      Weeks      Ago  And   Noticed   Pain   sev  Days  After       He  Has  Pain on  rom  And       yest   Symptoms  Got  Worse       Pain not  releived  By  otc  meds

## 2016-10-18 NOTE — ED Provider Notes (Signed)
[facility]    CSN: [id number] Arrival date & time: 10/18/16  1024     History   Chief Complaint Chief Complaint  Patient presents with  . Shoulder Injury    HPI Brandon Hartman is a 30 y.o. male.   HPI  He is a 30 year old man here for evaluation of right shoulder pain. He states this has been gradually worsening over the last 2-1/2 weeks. It started after he was at a party and arm wrestled 5 people. The pain is primarily in the posterior and lateral shoulder, lateral elbow, and biceps. It is worse with overhead movements, pronation, and activation of the biceps. He denies any weakness in his hand. He has been taking 800 mg of ibuprofen 3 times a day without improvement. Part of his job involves lifting heavy boxes.  Past Medical History:  Diagnosis Date  . ADHD (attention deficit hyperactivity disorder)   . Anxiety attack     Patient Active Problem List   Diagnosis Date Noted  . Anxiety state 11/30/2015  . Tobacco use disorder 11/30/2015  . Insomnia 11/30/2015    Past Surgical History:  Procedure Laterality Date  . MOUTH SURGERY         Home Medications    Prior to Admission medications   Medication Sig Start Date End Date Taking? Authorizing Provider  diclofenac (CATAFLAM) 50 MG tablet Take 1 tablet (50 mg total) by mouth 3 (three) times daily. 10/18/16   Charm RingsErin J Kelden Lavallee, MD  HYDROcodone-acetaminophen (NORCO) 5-325 MG tablet Take 1 tablet by mouth every 6 (six) hours as needed for moderate pain. 10/18/16   Charm RingsErin J Jauna Raczynski, MD    Family History History reviewed. No pertinent family history.  Social History Social History  Substance Use Topics  . Smoking status: Current Every Day Smoker    Packs/day: 1.00    Types: Cigarettes  . Smokeless tobacco: Never Used  . Alcohol use 3.6 oz/week    6 Cans of beer per week     Allergies   Review of patient's allergies indicates no known allergies.   Review of Systems Review of Systems As in history of  present illness  Physical Exam Triage Vital Signs ED Triage Vitals  Enc Vitals Group     BP 10/18/16 1046 137/66     Pulse Rate 10/18/16 1046 71     Resp 10/18/16 1046 16     Temp 10/18/16 1046 97.8 F (36.6 C)     Temp Source 10/18/16 1046 Oral     SpO2 10/18/16 1046 96 %     Weight --      Height --      Head Circumference --      Peak Flow --      Pain Score 10/18/16 1049 10     Pain Loc --      Pain Edu? --      Excl. in GC? --    No data found.   Updated Vital Signs BP 137/66 (BP Location: Left Arm)   Pulse 71   Temp 97.8 F (36.6 C) (Oral)   Resp 16   SpO2 96%   Visual Acuity Right Eye Distance:   Left Eye Distance:   Bilateral Distance:    Right Eye Near:   Left Eye Near:    Bilateral Near:     Physical Exam  Constitutional: He is oriented to person, place, and time. He appears well-developed and well-nourished.  Cardiovascular: Normal rate.   Pulmonary/Chest: Effort  normal.  Musculoskeletal:  Right shoulder: No erythema or swelling. No obvious deformity. He is tender along the distal supraspinatus at its insertion. He is also quite tender over the bicipital groove. Positive empty can and Hawkins. Range of motion testing limited by pain. Right elbow: No erythema or swelling. No obvious deformity. He is tender over the antecubital fossa and lateral elbow. Biceps tendon is somewhat tender. He has pain with pronation and biceps testing. 2+ radial pulse.  Neurological: He is alert and oriented to person, place, and time.     UC Treatments / Results  Labs (all labs ordered are listed, but only abnormal results are displayed) Labs Reviewed - No data to display  EKG  EKG Interpretation None       Radiology No results found.  Procedures Procedures (including critical care time)  Medications Ordered in UC Medications - No data to display   Initial Impression / Assessment and Plan / UC Course  I have reviewed the triage vital signs and the  nursing notes.  Pertinent labs & imaging results that were available during my care of the patient were reviewed by me and considered in my medical decision making (see chart for details).  Clinical Course    Multiple tendinitis, likely secondary to arm wrestling. Treat with rest, ice, and diclofenac. Discussed that he should stop OTC NSAIDs while taking diclofenac. Work note provided to limit lifting for the next week. Prescription given for hydrocodone to use as needed for severe pain. Follow-up with sports medicine if not improving in 1 week.  Final Clinical Impressions(s) / UC Diagnoses   Final diagnoses:  Rotator cuff tendonitis, right  Biceps tendonitis on right    New Prescriptions New Prescriptions   DICLOFENAC (CATAFLAM) 50 MG TABLET    Take 1 tablet (50 mg total) by mouth 3 (three) times daily.   HYDROCODONE-ACETAMINOPHEN (NORCO) 5-325 MG TABLET    Take 1 tablet by mouth every 6 (six) hours as needed for moderate pain.     Charm RingsErin J Makell Cyr, MD 10/18/16 1115

## 2016-10-18 NOTE — ED Notes (Signed)
xl  Arm  Sling  Applied

## 2016-10-26 ENCOUNTER — Encounter (HOSPITAL_COMMUNITY): Payer: Self-pay | Admitting: Emergency Medicine

## 2016-10-26 ENCOUNTER — Ambulatory Visit (HOSPITAL_COMMUNITY)
Admission: EM | Admit: 2016-10-26 | Discharge: 2016-10-26 | Disposition: A | Discharge status: Home / Self Care | Payer: Medicaid Other | Attending: Emergency Medicine | Admitting: Emergency Medicine

## 2016-10-26 DIAGNOSIS — M7521 Bicipital tendinitis, right shoulder: Secondary | ICD-10-CM

## 2016-10-26 MED ORDER — HYDROCODONE-ACETAMINOPHEN 5-325 MG PO TABS: 1.0000 | ORAL_TABLET | Freq: Four times a day (QID) | ORAL | 0 refills | Status: DC | PRN

## 2016-10-26 MED ORDER — PREDNISONE 50 MG PO TABS: ORAL_TABLET | ORAL | 0 refills | Status: DC

## 2016-10-26 NOTE — Discharge Instructions (Signed)
We're going to try prednisone for 5 days. Use the hydrocodone as needed for severe pain. Go ahead and make an appointment with the sports medicine center for next week.

## 2016-10-26 NOTE — ED Provider Notes (Signed)
[facility]    CSN: [id number] Arrival date & time: 10/26/16  1319     History   Chief Complaint Chief Complaint  Patient presents with  . Follow-up    HPI Brandon Hartman is a 30 y.o. male.   HPI He is a 30 year old man here for recheck of right shoulder. I saw the patient one week ago for multiple tendinopathy is in the right shoulder and elbow. He was unable to get the diclofenac due to cost. He has been taking Aleve with minimal improvement. The hydrocodone does a good job of relieving his pain. He states the pain improved over the weekend when he was out of work, but flared up again on Monday. He thinks he may have restrained the shoulder on Monday. Has primarily located in the anterior and lateral shoulder as well as the biceps. His elbow discomfort has improved.  Past Medical History:  Diagnosis Date  . ADHD (attention deficit hyperactivity disorder)   . Anxiety attack     Patient Active Problem List   Diagnosis Date Noted  . Anxiety state 11/30/2015  . Tobacco use disorder 11/30/2015  . Insomnia 11/30/2015    Past Surgical History:  Procedure Laterality Date  . MOUTH SURGERY         Home Medications    Prior to Admission medications   Medication Sig Start Date End Date Taking? Authorizing Provider  HYDROcodone-acetaminophen (NORCO) 5-325 MG tablet Take 1 tablet by mouth every 6 (six) hours as needed for moderate pain. 10/26/16   Charm RingsErin J Honig, MD  predniSONE (DELTASONE) 50 MG tablet Take 1 pill daily for 5 days. 10/26/16   Charm RingsErin J Honig, MD    Family History History reviewed. No pertinent family history.  Social History Social History  Substance Use Topics  . Smoking status: Current Every Day Smoker    Packs/day: 1.00    Types: Cigarettes  . Smokeless tobacco: Never Used  . Alcohol use 3.6 oz/week    6 Cans of beer per week     Allergies   Patient has no known allergies.   Review of Systems Review of Systems As in history of  present illness  Physical Exam Triage Vital Signs ED Triage Vitals  Enc Vitals Group     BP 10/26/16 1333 109/65     Pulse Rate 10/26/16 1333 76     Resp 10/26/16 1333 18     Temp 10/26/16 1333 98.2 F (36.8 C)     Temp Source 10/26/16 1333 Oral     SpO2 10/26/16 1333 99 %     Weight --      Height --      Head Circumference --      Peak Flow --      Pain Score 10/26/16 1337 9     Pain Loc --      Pain Edu? --      Excl. in GC? --    No data found.   Updated Vital Signs BP 109/65 (BP Location: Left Arm)   Pulse 76   Temp 98.2 F (36.8 C) (Oral)   Resp 18   SpO2 99%   Visual Acuity Right Eye Distance:   Left Eye Distance:   Bilateral Distance:    Right Eye Near:   Left Eye Near:    Bilateral Near:     Physical Exam  Constitutional: He is oriented to person, place, and time. He appears well-developed and well-nourished. No distress.  Cardiovascular: Normal  rate.   Pulmonary/Chest: Effort normal.  Musculoskeletal:  Right shoulder: No erythema or edema. Tender primarily over the bicipital groove. Positive empty can. 2+ radial pulse.  Neurological: He is alert and oriented to person, place, and time.     UC Treatments / Results  Labs (all labs ordered are listed, but only abnormal results are displayed) Labs Reviewed - No data to display  EKG  EKG Interpretation None       Radiology No results found.  Procedures Procedures (including critical care time)  Medications Ordered in UC Medications - No data to display   Initial Impression / Assessment and Plan / UC Course  I have reviewed the triage vital signs and the nursing notes.  Pertinent labs & imaging results that were available during my care of the patient were reviewed by me and considered in my medical decision making (see chart for details).  Clinical Course     We'll treat with prednisone for inflammation. Provided additional 15 tablets of hydrocodone to use as needed for severe  pain. Reviewed the [place] controlled substance database and it is consistent. Recommended he make an appointment at the [facility] for next week for follow-up.  Final Clinical Impressions(s) / UC Diagnoses   Final diagnoses:  Biceps tendonitis on right    New Prescriptions New Prescriptions   PREDNISONE (DELTASONE) 50 MG TABLET    Take 1 pill daily for 5 days.     Charm RingsErin J Honig, MD 10/26/16 [phone]

## 2016-10-26 NOTE — ED Triage Notes (Addendum)
[facility]. The patient [facility] to his right shoulder. The patient stated that he was told to come back if his shoulder continued to hurt. The patient reported continued pain. The patient stated that his insurance would not cover the [place] so the pharmacist told him to use Aleve.

## 2017-02-16 ENCOUNTER — Ambulatory Visit (HOSPITAL_COMMUNITY)
Admission: EM | Admit: 2017-02-16 | Discharge: 2017-02-16 | Disposition: A | Discharge status: Home / Self Care | Payer: Medicaid Other | Attending: Family Medicine | Admitting: Family Medicine

## 2017-02-16 ENCOUNTER — Encounter (HOSPITAL_COMMUNITY): Payer: Self-pay | Admitting: Family Medicine

## 2017-02-16 DIAGNOSIS — M25562 Pain in left knee: Secondary | ICD-10-CM | POA: Diagnosis not present

## 2017-02-16 DIAGNOSIS — S83422A Sprain of lateral collateral ligament of left knee, initial encounter: Secondary | ICD-10-CM | POA: Diagnosis not present

## 2017-02-16 MED ORDER — DICLOFENAC SODIUM 75 MG PO TBEC: 75.0000 mg | DELAYED_RELEASE_TABLET | Freq: Two times a day (BID) | ORAL | 0 refills | Status: DC

## 2017-02-16 MED ORDER — PREDNISONE 10 MG (21) PO TBPK: ORAL_TABLET | Freq: Every day | ORAL | 0 refills | Status: DC
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
## 2017-02-16 NOTE — Discharge Instructions (Signed)
For your knee pain, I ordered a knee sleeve. He applied here in clinic today. I have written for prescription for prednisone, take as directed. I also prescribed a medicine for pain called diclofenac, take one tablet twice a day as needed for pain. I recommend rest, ice 15 minutes at a time alternated with heat, up to 4 times a day, and elevation of the affected extremity. Should your pain persist, return to clinic as needed, or follow-up with orthopedics.
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
## 2017-12-06 ENCOUNTER — Emergency Department (HOSPITAL_COMMUNITY)
Admission: EM | Admit: 2017-12-06 | Discharge: 2017-12-06 | Disposition: A | Discharge status: Home / Self Care | Payer: Medicaid Other | Attending: Emergency Medicine | Admitting: Emergency Medicine

## 2017-12-06 ENCOUNTER — Emergency Department (HOSPITAL_COMMUNITY): Payer: Medicaid Other

## 2017-12-06 ENCOUNTER — Encounter (HOSPITAL_COMMUNITY): Payer: Self-pay | Admitting: *Deleted

## 2017-12-06 ENCOUNTER — Other Ambulatory Visit: Payer: Self-pay

## 2017-12-06 DIAGNOSIS — G544 Lumbosacral root disorders, not elsewhere classified: Secondary | ICD-10-CM | POA: Insufficient documentation

## 2017-12-06 DIAGNOSIS — F17210 Nicotine dependence, cigarettes, uncomplicated: Secondary | ICD-10-CM | POA: Diagnosis not present

## 2017-12-06 DIAGNOSIS — F909 Attention-deficit hyperactivity disorder, unspecified type: Secondary | ICD-10-CM | POA: Insufficient documentation

## 2017-12-06 DIAGNOSIS — M545 Low back pain: Secondary | ICD-10-CM | POA: Diagnosis present

## 2017-12-06 DIAGNOSIS — Z79899 Other long term (current) drug therapy: Secondary | ICD-10-CM | POA: Diagnosis not present

## 2017-12-06 DIAGNOSIS — F419 Anxiety disorder, unspecified: Secondary | ICD-10-CM | POA: Diagnosis not present

## 2017-12-06 DIAGNOSIS — M5416 Radiculopathy, lumbar region: Secondary | ICD-10-CM

## 2017-12-06 MED ORDER — OXYCODONE-ACETAMINOPHEN 5-325 MG PO TABS: 2.0000 | ORAL_TABLET | Freq: Three times a day (TID) | ORAL | 0 refills | Status: DC | PRN

## 2017-12-06 MED ORDER — ACETAMINOPHEN 500 MG PO TABS
1000.0000 mg | ORAL_TABLET | Freq: Once | ORAL | Status: AC
  Administered 2017-12-06: 1000 mg via ORAL
  Filled 2017-12-06: qty 2

## 2017-12-06 MED ORDER — LORAZEPAM 2 MG/ML IJ SOLN
1.0000 mg | Freq: Once | INTRAMUSCULAR | Status: AC | PRN
  Administered 2017-12-06: 1 mg via INTRAMUSCULAR
  Filled 2017-12-06: qty 1

## 2017-12-06 MED ORDER — METHYLPREDNISOLONE SODIUM SUCC 125 MG IJ SOLR
60.0000 mg | Freq: Once | INTRAMUSCULAR | Status: AC
  Administered 2017-12-06: 60 mg via INTRAMUSCULAR
  Filled 2017-12-06: qty 2

## 2017-12-06 MED ORDER — PREDNISONE 10 MG (21) PO TBPK: ORAL_TABLET | Freq: Every day | ORAL | 0 refills | Status: DC

## 2017-12-06 MED ORDER — HYDROMORPHONE HCL 1 MG/ML IJ SOLN
1.0000 mg | Freq: Once | INTRAMUSCULAR | Status: AC
  Administered 2017-12-06: 1 mg via INTRAMUSCULAR
  Filled 2017-12-06: qty 1

## 2017-12-06 NOTE — ED Provider Notes (Signed)
[facility] Provider Note   CSN: [id number] Arrival date & time: 12/06/17  1605     History   Chief Complaint Chief Complaint  Patient presents with  . Back Pain    HPI Brandon Hartman is a 31 y.o. male with a h/o of anxiety who presents to the emergency department with a chief complaint of constant, severe tingling, burning back pain that began when he awoke this morning. No recent injury or trauma. He was pain free when he went to bed last night.   He reports the pain is in his mid and low back and radiates down his bilateral legs. He reports that initially the pain radiated to his bilateral thighs, but now radiates to his toes. He has been ambulatory with assistance, but has not ambulated independently since this AM because he states his legs are giving out.  He denies fever, chills, neck pain or stiffness, muscle spasms, dysuria, penile or testicular pain or swelling, urinary or fecal incontinence, headache, dizziness, lightheadedness, or visual changes. No h/o of DM, CA, or IVDU.   No treatment prior to arrival.  No recent URI or viral symptoms.  He is currently unemployed.   The history is provided by the patient. No language interpreter was used.  Back Pain   The pain is present in the thoracic spine and lumbar spine. The pain radiates to the right thigh, left thigh, right foot and left foot. Associated symptoms include numbness, paresthesias and weakness. Pertinent negatives include no chest pain, no fever, no headaches, no abdominal pain and no bladder incontinence.    Past Medical History:  Diagnosis Date  . ADHD (attention deficit hyperactivity disorder)   . Anxiety attack     Patient Active Problem List   Diagnosis Date Noted  . Anxiety state 11/30/2015  . Tobacco use disorder 11/30/2015  . Insomnia 11/30/2015    Past Surgical History:  Procedure Laterality Date  . MOUTH SURGERY         Home Medications    Prior to  Admission medications   Medication Sig Start Date End Date Taking? Authorizing Provider  diclofenac (VOLTAREN) 75 MG EC tablet Take 1 tablet (75 mg total) by mouth 2 (two) times daily. 02/16/17   Dorena BodoKennard, Lawrence, NP  oxyCODONE-acetaminophen (PERCOCET/ROXICET) 5-325 MG tablet Take 2 tablets by mouth every 8 (eight) hours as needed for severe pain. 12/06/17   Alexarae Oliva A, PA-C  predniSONE (STERAPRED UNI-PAK 21 TAB) 10 MG (21) TBPK tablet Take by mouth daily. Take 6 tabs by mouth daily  for 2 days, then 5 tabs for 2 days, then 4 tabs for 2 days, then 3 tabs for 2 days, 2 tabs for 2 days, then 1 tab by mouth daily for 2 days 12/06/17   Barkley BoardsMcDonald, Tresha Muzio A, PA-C    Family History History reviewed. No pertinent family history.  Social History Social History   Tobacco Use  . Smoking status: Current Every Day Smoker    Packs/day: 1.00    Types: Cigarettes  . Smokeless tobacco: Never Used  Substance Use Topics  . Alcohol use: Yes    Alcohol/week: 3.6 oz    Types: 6 Cans of beer per week  . Drug use: No    Comment: denies use 01/05/16     Allergies   Patient has no known allergies.   Review of Systems Review of Systems  Constitutional: Negative for chills and fever.  Respiratory: Negative for shortness of breath.  Cardiovascular: Negative for chest pain.  Gastrointestinal: Negative for abdominal pain, diarrhea, nausea and vomiting.  Genitourinary: Negative for bladder incontinence.  Musculoskeletal: Positive for back pain and gait problem. Negative for neck pain and neck stiffness.  Allergic/Immunologic: Negative for immunocompromised state.  Neurological: Positive for weakness, numbness and paresthesias. Negative for dizziness, syncope, light-headedness and headaches.   Physical Exam Updated Vital Signs BP 118/70   Pulse 72   Temp (!) 97.5 F (36.4 C) (Oral)   Resp 18   SpO2 99%   Physical Exam  Constitutional: He is oriented to person, place, and time. He appears  well-developed.  HENT:  Head: Normocephalic.  Eyes: Conjunctivae are normal.  Neck: Neck supple.  No meningeal signs.  Cardiovascular: Normal rate, regular rhythm, normal heart sounds and intact distal pulses. Exam reveals no gallop and no friction rub.  No murmur heard. Pulmonary/Chest: Effort normal and breath sounds normal. No stridor. No respiratory distress. He has no wheezes. He has no rales. He exhibits no tenderness.  Abdominal: Soft. Bowel sounds are normal. He exhibits no distension and no mass. There is no tenderness. There is no rebound and no guarding. No hernia.  Musculoskeletal: He exhibits tenderness. He exhibits no edema or deformity.  Tenderness to palpation of the spinous processes or surrounding paraspinal muscles of the cervical spine.  Tender to palpation over the spinous processes of the thoracic and lumbar spine.  Pain with palpation increases with caudally.  The bilateral paraspinal muscles of the thoracic and lumbar spine are nontender.  No CVA tenderness bilaterally.  No overlying erythema, edema, or warmth of the skin of the back.  Neurological: He is alert and oriented to person, place, and time.  Cranial nerves II through XII are grossly intact.  No pronator drift.  Negative Romberg.  Decreased sensation with sharp and light touch on the left as compared to the right lower extremity.  5 out of 5 strength with dorsiflexion plantarflexion to the bilateral lower extremities.  No asymmetric weakness in the large muscle groups of the bilateral lower extremities.  DTRs are 2+ and symmetric. DP and PT pulses are 2+ and symmetric.  The digits of the bilateral feet appear well perfused. Negative Babinski.   Radial pulses are 2+ and symmetric.  5 out of 5 strength of bilateral upper extremities against resistance.  Good capillary refill.  Able to bear weight on the bilateral lower extremities.  Patient is able to ambulate, but requires assistance.  Antalgic gait.  Skin: Skin  is warm and dry. Capillary refill takes less than 2 seconds. No rash noted. No erythema. No pallor.  Psychiatric: His behavior is normal.  Nursing note and vitals reviewed.  ED Treatments / Results  Labs (all labs ordered are listed, but only abnormal results are displayed) Labs Reviewed - No data to display  EKG  EKG Interpretation None       Radiology Mr Thoracic Spine Wo Contrast  Result Date: 12/06/2017 CLINICAL DATA:  Mid and lower back pain. No injury. Numbness in the bilateral feet with burning in the bilateral hamstrings. EXAM: MRI THORACIC AND LUMBAR SPINE WITHOUT CONTRAST TECHNIQUE: Multiplanar and multiecho pulse sequences of the thoracic and lumbar spine were obtained without intravenous contrast. COMPARISON:  None. FINDINGS: MRI THORACIC SPINE FINDINGS Alignment:  Physiologic. Vertebrae: No fracture, evidence of discitis, or bone lesion. Cord:  Normal signal and morphology. Paraspinal and other soft tissues: Negative. Disc levels: Small right paracentral disc protrusions at C6-C7, T10-T11, and T11-T12. The disc protrusion at  T11-T12 minimally indents the right ventral thecal sac. No spinal canal or neuroforaminal stenosis at any level. MRI LUMBAR SPINE FINDINGS Segmentation:  Assumed standard. Alignment:  Physiologic. Vertebrae: No fracture, evidence of discitis, or bone lesion. Small hemangiomas in the L4 and S1 vertebral bodies. Conus medullaris and cauda equina: Conus extends to the L1 level. Conus and cauda equina appear normal. Paraspinal and other soft tissues: Negative. Disc levels: T12-L1:  Mild disc desiccation and degeneration.  No stenosis. L1-L2:  Negative. L2-L3: Small diffuse disc bulge without spinal canal or neuroforaminal stenosis. L3-L4:  Negative. L4-L5:  Negative. L5-S1: Left paracentral and foraminal disc protrusion moderately narrowing the left neural foramen and impinging on the exiting left L5 nerve root. No spinal canal or right neuroforaminal stenosis.  IMPRESSION: MR THORACIC SPINE IMPRESSION 1. No acute abnormality. Mild degenerative disc disease at C6-C7, T10-T11, T11-T12. No spinal canal or neuroforaminal stenosis at any level. MR LUMBAR SPINE IMPRESSION 1. No acute abnormality. Left paracentral and foraminal disc protrusion at L5-S1 moderately narrows the left neural foramen and impinges on the exiting left L5 nerve root. Electronically Signed   By: Obie Dredge M.D.   On: 12/06/2017 20:17   Mr Lumbar Spine Wo Contrast  Result Date: 12/06/2017 CLINICAL DATA:  Mid and lower back pain. No injury. Numbness in the bilateral feet with burning in the bilateral hamstrings. EXAM: MRI THORACIC AND LUMBAR SPINE WITHOUT CONTRAST TECHNIQUE: Multiplanar and multiecho pulse sequences of the thoracic and lumbar spine were obtained without intravenous contrast. COMPARISON:  None. FINDINGS: MRI THORACIC SPINE FINDINGS Alignment:  Physiologic. Vertebrae: No fracture, evidence of discitis, or bone lesion. Cord:  Normal signal and morphology. Paraspinal and other soft tissues: Negative. Disc levels: Small right paracentral disc protrusions at C6-C7, T10-T11, and T11-T12. The disc protrusion at T11-T12 minimally indents the right ventral thecal sac. No spinal canal or neuroforaminal stenosis at any level. MRI LUMBAR SPINE FINDINGS Segmentation:  Assumed standard. Alignment:  Physiologic. Vertebrae: No fracture, evidence of discitis, or bone lesion. Small hemangiomas in the L4 and S1 vertebral bodies. Conus medullaris and cauda equina: Conus extends to the L1 level. Conus and cauda equina appear normal. Paraspinal and other soft tissues: Negative. Disc levels: T12-L1:  Mild disc desiccation and degeneration.  No stenosis. L1-L2:  Negative. L2-L3: Small diffuse disc bulge without spinal canal or neuroforaminal stenosis. L3-L4:  Negative. L4-L5:  Negative. L5-S1: Left paracentral and foraminal disc protrusion moderately narrowing the left neural foramen and impinging on the  exiting left L5 nerve root. No spinal canal or right neuroforaminal stenosis. IMPRESSION: MR THORACIC SPINE IMPRESSION 1. No acute abnormality. Mild degenerative disc disease at C6-C7, T10-T11, T11-T12. No spinal canal or neuroforaminal stenosis at any level. MR LUMBAR SPINE IMPRESSION 1. No acute abnormality. Left paracentral and foraminal disc protrusion at L5-S1 moderately narrows the left neural foramen and impinges on the exiting left L5 nerve root. Electronically Signed   By: Obie Dredge M.D.   On: 12/06/2017 20:17    Procedures Procedures (including critical care time)  Medications Ordered in ED Medications  LORazepam (ATIVAN) injection 1 mg (1 mg Intramuscular Given 12/06/17 1840)  acetaminophen (TYLENOL) tablet 1,000 mg (1,000 mg Oral Given 12/06/17 1802)  HYDROmorphone (DILAUDID) injection 1 mg (1 mg Intramuscular Given 12/06/17 2058)  methylPREDNISolone sodium succinate (SOLU-MEDROL) 125 mg/2 mL injection 60 mg (60 mg Intramuscular Given 12/06/17 2115)     Initial Impression / Assessment and Plan / ED Course  I have reviewed the triage vital signs and the  nursing notes.  Pertinent labs & imaging results that were available during my care of the patient were reviewed by me and considered in my medical decision making (see chart for details).     31 year old male presenting with atraumatic pain of the thoracic spine that radiates as a burning pain down the bilateral legs, worsening since this a.m.  No recent trauma or injury.  On physical exam, no notable weakness; however the patient does have decreased sensation to sharp and light touch on the left leg as compared to the right.  No deficits in the right upper extremities.  Gait appears antalgic, but he is able to bear weight on the bilateral lower extremities.  No meningeal signs.  The patient was discussed with Dr. Adriana Simasook, attending physician.  MR of the thoracic and lumbar spine with left paracentral and foraminal disc protrusion  at L5-S1 which moderately narrows the left neural foramen and impinges on the exiting left L5 nerve root.  This appears consistent with the patient's exam.  Doubt cauda equina, trans myelitis.  The MRI results were discussed with the patient and his significant other.  His pain was treated in the emergency department.  Will discharge the patient with a short course of pain medication, a steroid taper, and follow-up to neurosurgery. A 5217-month prescription history query was performed using the [place] CSRS prior to discharge.  The patient is a agreeable with the plan at this time.  He is hemodynamically stable.  No acute distress.  The patient is safe for discharge to home at this time.   Final Clinical Impressions(s) / ED Diagnoses   Final diagnoses:  Lumbar nerve root compression    ED Discharge Orders        Ordered    oxyCODONE-acetaminophen (PERCOCET/ROXICET) 5-325 MG tablet  Every 8 hours PRN     12/06/17 2107    predniSONE (STERAPRED UNI-PAK 21 TAB) 10 MG (21) TBPK tablet  Daily     12/06/17 2107       Frederik PearMcDonald, Lupie Sawa A, PA-C 12/07/17 0054    Donnetta Hutchingook, Brian, MD 12/08/17 1335

## 2017-12-06 NOTE — ED Triage Notes (Signed)
Pt reports onset last night of mid lower back pain. Has numbness sensation to bilateral legs and difficulty ambulating. Denies injury to back.

## 2017-12-06 NOTE — ED Notes (Signed)
Pt still in MRI 

## 2017-12-06 NOTE — Discharge Instructions (Signed)
You have been given 1 dose of Solu-Medrol tonight in the emergency department.  Starting tomorrow morning, please take prednisone as follows: take 6 tabs by mouth daily  for 2 days, then 5 tabs for 2 days, then 4 tabs for 2 days, then 3 tabs for 2 days, 2 tabs for 2 days, then 1 tab by mouth daily for 2 days.  Take 800 mg of ibuprofen with food every 8 hours as needed for pain and inflammation control.  For severe pain, you can take 1 tablet of Percocet every 8 hours.  Do not take this medication unless her pain is severe.  This is a narcotic and can impair your ability to work and drive.  It can also be addicting.   You can apply ice for 15-20 minutes up to 3-4 times a day.  I have also included stretches that may also help with pain and inflammation.   Please call [facility] to schedule a follow-up appointment.  If you develop new or worsening symptoms, including fever, chills, redness, warmth, or swelling over the back, weakness or the inability to walk, he is return to the emergency department for reevaluation.
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
## 2017-12-06 NOTE — ED Notes (Signed)
Pt requesting pain medication, PA aware.

## 2018-07-24 ENCOUNTER — Other Ambulatory Visit: Payer: Self-pay

## 2018-07-24 ENCOUNTER — Ambulatory Visit: Payer: Medicaid Other | Attending: Neurological Surgery | Admitting: Physical Therapy

## 2018-07-24 ENCOUNTER — Encounter: Payer: Self-pay | Admitting: Physical Therapy

## 2018-07-24 DIAGNOSIS — M5126 Other intervertebral disc displacement, lumbar region: Secondary | ICD-10-CM

## 2018-07-24 DIAGNOSIS — G8929 Other chronic pain: Secondary | ICD-10-CM | POA: Insufficient documentation

## 2018-07-24 DIAGNOSIS — M545 Low back pain: Secondary | ICD-10-CM | POA: Insufficient documentation

## 2018-07-24 NOTE — Therapy (Addendum)
[place], [place], [address] Phone: [phone]   Fax:  [phone]  Physical Therapy Evaluation / Discharge Summary  Patient Details  Name: Brandon Hartman MRN: [id number] Date of Birth: Apr 14, 1986 Referring Provider: Kristeen Miss MD   Encounter Date: 07/24/2018  PT End of Session - 07/24/18 1538    Visit Number  1    Number of Visits  13    Date for PT Re-Evaluation  09/18/18    Authorization Type  MCD (re-assess at 4th visit)    PT Start Time  1540    PT Stop Time  1618    PT Time Calculation (min)  38 min    Activity Tolerance  Patient tolerated treatment well    Behavior During Therapy  [facility] for tasks assessed/performed       Past Medical History:  Diagnosis Date  . ADHD (attention deficit hyperactivity disorder)   . Anxiety attack     Past Surgical History:  Procedure Laterality Date  . MOUTH SURGERY      There were no vitals filed for this visit.   Subjective Assessment - 07/24/18 1544    Subjective  pt is a 32 y.o M with CC of low back pain that started hurting back in [place] rof 2018 with no specific onset.  reports he has some referral of pain and N/ Tdown the LLE to the back of the knee. He had and MRI done in December. pt denies any red flags. Since onset the pain seems to be the same, and he reports no hx of back problems.     Limitations  Standing;Lifting    How long can you sit comfortably?  hour     How long can you stand comfortably?  10 min     How long can you walk comfortably?  hour     Diagnostic tests  MRI    Patient Stated Goals  to feel better, get back to play basketball.     Currently in Pain?  Yes    Pain Score  4  last took medication at 2:30, at worst 8-9/10    Pain Location  Back    Pain Orientation  Left    Pain Descriptors / Indicators  Sharp;Pins and needles;Tingling    Pain Type  Chronic pain    Pain Radiating Towards  to the knee on the L leg     Pain  Onset  More than a month ago    Pain Frequency  Constant    Aggravating Factors   bending, standing, walking for long periods of time, lifting, twisting     Pain Relieving Factors  medication, sitting and resting, icing    Effect of Pain on Daily Activities  limited endurance, walking, standing.         [facility] PT Assessment - 07/24/18 1533      Assessment   Medical Diagnosis  HNP    Referring Provider  Kristeen Miss MD    Onset Date/Surgical Date  -- December 2018    Hand Dominance  Right    Next MD Visit  08/14/2018    Prior Therapy  no      Precautions   Precaution Comments  pt reports previous provider gave him no lifting over 10#      Restrictions   Weight Bearing Restrictions  No      Balance Screen   Has the patient fallen in the past 6 months  No  Has the patient had a decrease in activity level because of a fear of falling?   No    Is the patient reluctant to leave their home because of a fear of falling?   No      Home Film/video editor residence    Living Arrangements  Spouse/significant other    Available Help at Discharge  Family    Type of [place] to enter    Entrance Stairs-Number of Steps  6    Entrance Stairs-Rails  Can reach both    [place]  One level    [place]      Prior Function   Level of [place] with basic ADLs    Vocation  Part time employment lawn maintenance    Vocation Requirements  lifting, bending, twisting, sitting    Leisure  basketball, going to the pool, hanging out with fmaily      Cognition   Overall Cognitive Status  Within Functional Limits for tasks assessed      Observation/Other Assessments   Oswestry Disability Index   32% disability      Posture/Postural Control   Posture/Postural Control  Postural limitations    Postural Limitations  Rounded Shoulders;Forward head      ROM / Strength   AROM / PROM / Strength  AROM;Strength       AROM   AROM Assessment Site  Lumbar    Lumbar Flexion  100    Lumbar Extension  28 pinching sensation    Lumbar - Right Side Bend  20 ERP     Lumbar - Left Side Bend  33      Strength   Strength Assessment Site  Hip;Knee    Right/Left Hip  Right;Left    Right Hip Flexion  4+/5    Right Hip Extension  4+/5    Right Hip ABduction  4+/5    Left Hip Flexion  4+/5    Left Hip Extension  4+/5    Left Hip ABduction  4/5    Right/Left Knee  Right;Left    Right Knee Flexion  5/5    Right Knee Extension  5/5    Left Knee Flexion  5/5    Left Knee Extension  5/5      Palpation   Spinal mobility  functional PAVIM of lumbar spine    Palpation comment  TTP noted along the L lumbar paraspinals, and spinous process of L3-L5      Special Tests    Special Tests  Lumbar    Lumbar Tests  Slump Test;Prone Knee Bend Test;Straight Leg Raise      Straight Leg Raise   Findings  Positive    Side   Left    Comment  ipsilateral leg raise testing                Objective measurements completed on examination: See above findings.              PT Education - 07/24/18 1539    Education Details  evaluation findings, POC, goals, HEP with proper form/ rationale. anatomy of disc and biomechanics.     Person(s) Educated  Patient;Parent(s)    Methods  Explanation;Verbal cues    Comprehension  Verbalized understanding;Verbal cues required       PT Short Term Goals - 07/24/18 1621      PT SHORT TERM GOAL #  1   Title  pt to be I with inital HEP     Baseline  no previous HEP    Time  3    Period  Weeks    Status  New    Target Date  09/04/18      PT SHORT TERM GOAL #2   Title  pt to demo prope posture in various positions and lifting activitis to prevent and reduce low back pain    Baseline  no knowledge of proper posture    Time  3    Period  Weeks    Status  New    Target Date  08/14/18        PT Long Term Goals - 07/24/18 1622      PT LONG TERM GOAL #1   Title  pt  to increase trunk mobulity in all planes to [facility] reporting no pain or radicular symptoms for work related activities and ADLs     Baseline  pt reported 4/10 pain today with all ROM measures    Time  6    Period  Weeks    Status  New    Target Date  09/04/18      PT LONG TERM GOAL #2   Title  pt to be able to return to playing basketball and all activities per pt goal reporting no pain for improvement in QOL     Baseline  pt reports not being able to play basketball due to pain     Time  6    Period  Weeks    Status  New    Target Date  09/04/18      PT LONG TERM GOAL #3   Title  pt to increase ODI by >/= 12% (to get to 20%) to improve to minimal disability in order to demonstrate functional improvement    Baseline  initial ODI 32%    Time  6    Period  Weeks    Status  New    Target Date  09/04/18      PT LONG TERM GOAL #4   Title  pt to be I with all HEP given as of last visit to progress and maintain current level of function     Baseline  no previous HEP     Time  6    Period  Weeks    Status  New    Target Date  09/04/18             Plan - 07/24/18 1617    Clinical Impression Statement  pt presents to OPPT with CC of chronic low back pain with insidious onset starting December 2018. pt demonstrates functional trunk mobility in all planes reporting reproduction of LLE tingling with flexion and pinching sensation with extension. funcitonal strengtin in bil Le. with prone extension pt reported centralization and exhibited extension bias confirming dx. He would benefit from physical therapy to reduce low back pain, decrease spasm, promote proper posture and maximize function by addressing the deficits listed.     Clinical Presentation  Stable    Clinical Decision Making  Low    Rehab Potential  Good    PT Frequency  2x / week    PT Duration  6 weeks    PT Treatment/Interventions  ADLs/Self Care Home Management;Cryotherapy;Electrical Stimulation;Iontophoresis 61m/ml  Dexamethasone;Moist Heat;Traction;Ultrasound;Neuromuscular re-education;Therapeutic exercise;Therapeutic activities;Manual techniques;Dry needling;Patient/family education    PT Next Visit Plan  review/ update HEP PRN, progression of extension biased treatment,  posture education, core / back strengthening. modalities PRN    PT Home Exercise Plan  lower trunk rotation, prone on elbows with progression to prone press-up. posterior pelvic tilt    Consulted and Agree with Plan of Care  Patient       Patient will benefit from skilled therapeutic intervention in order to improve the following deficits and impairments:  Pain, Improper body mechanics, Postural dysfunction, Decreased activity tolerance, Decreased endurance, Increased fascial restricitons, Increased muscle spasms  Visit Diagnosis: Chronic bilateral low back pain, with sciatica presence unspecified   HNP (herniated nucleus pulposus), lumbar       Problem List Patient Active Problem List   Diagnosis Date Noted  . Anxiety state 11/30/2015  . Tobacco use disorder 11/30/2015  . Insomnia 11/30/2015   Starr Lake PT, DPT, LAT, ATC  07/24/18  4:36 PM      [facility] [address] [place], [place], [address] Phone: [phone]   Fax:  [phone]  Name: Brandon Hartman MRN: [id number] Date of Birth: 1986-06-08          PHYSICAL THERAPY DISCHARGE SUMMARY  Visits from Start of Care: 1  Current functional level related to goals / functional outcomes: See goals   Remaining deficits: unknown   Education / Equipment: HEP  Plan: Patient agrees to discharge.  Patient goals were not met. Patient is being discharged due to not returning since the last visit.  ?????         Kristoffer Leamon PT, DPT, LAT, ATC  08/21/18  8:35 AM

## 2018-07-24 NOTE — Addendum Note (Signed)
Addended by: Milford CageLEAMON, Terressa Evola L on: 07/24/2018 04:44 PM   Modules accepted: Orders

## 2018-08-06 ENCOUNTER — Ambulatory Visit: Payer: Medicaid Other | Admitting: Physical Therapy

## 2018-08-13 ENCOUNTER — Ambulatory Visit: Payer: Medicaid Other | Admitting: Physical Therapy

## 2018-08-13 ENCOUNTER — Telehealth: Payer: Self-pay | Admitting: Physical Therapy

## 2018-08-13 NOTE — Telephone Encounter (Signed)
Spoke with pt about missing today's scheduled appointment and he reported he ment to call but has been sick and been out of it today. He plans to go to his PCP tomorrow due swelling in his tongue but he planes to attended his next scheduled visit.

## 2018-08-20 ENCOUNTER — Ambulatory Visit: Payer: Medicaid Other | Attending: Neurological Surgery | Admitting: Physical Therapy

## 2018-08-21 ENCOUNTER — Telehealth: Payer: Self-pay | Admitting: Physical Therapy

## 2018-08-21 NOTE — Telephone Encounter (Signed)
Attempted to call pt regarding missed visit on 9/3 and that it was his 3rd missed appointment which per company policy is grounds for discharge. Unable to leave message because pt's mailbox is full.

## 2019-09-07 ENCOUNTER — Encounter (HOSPITAL_COMMUNITY): Payer: Self-pay | Admitting: Family Medicine

## 2019-09-07 ENCOUNTER — Ambulatory Visit (HOSPITAL_COMMUNITY)
Admission: EM | Admit: 2019-09-07 | Discharge: 2019-09-07 | Disposition: A | Discharge status: Home / Self Care | Payer: Medicaid Other | Attending: Family Medicine | Admitting: Family Medicine

## 2019-09-07 ENCOUNTER — Ambulatory Visit (INDEPENDENT_AMBULATORY_CARE_PROVIDER_SITE_OTHER): Payer: Medicaid Other

## 2019-09-07 ENCOUNTER — Other Ambulatory Visit: Payer: Self-pay

## 2019-09-07 DIAGNOSIS — S93491A Sprain of other ligament of right ankle, initial encounter: Secondary | ICD-10-CM | POA: Diagnosis not present

## 2019-09-07 NOTE — ED Provider Notes (Signed)
[facility]    CSN: [id number] Arrival date & time: 09/07/19  1045      History   Chief Complaint Chief Complaint  Patient presents with  . Ankle Pain    HPI Brandon Hartman is a 33 y.o. male.   Is a 33 year old man who is a established patient at [facility] urgent [facility].  Comes in with a right ankle injury.  Patient was playing basketball yesterday afternoon when they rolled his ankle and had immediate pain.  He wrapped it up.  He denies any other injury at the time.  He was able to drive himself over here.     Past Medical History:  Diagnosis Date  . ADHD (attention deficit hyperactivity disorder)   . Anxiety attack     Patient Active Problem List   Diagnosis Date Noted  . Anxiety state 11/30/2015  . Tobacco use disorder 11/30/2015  . Insomnia 11/30/2015    Past Surgical History:  Procedure Laterality Date  . MOUTH SURGERY         Home Medications    Prior to Admission medications   Medication Sig Start Date End Date Taking? Authorizing Provider  diclofenac (VOLTAREN) 75 MG EC tablet Take 1 tablet (75 mg total) by mouth 2 (two) times daily. Patient not taking: Reported on 07/24/2018 02/16/17   Dorena BodoKennard, Lawrence, NP  gabapentin (NEURONTIN) 300 MG capsule Take 300 mg by mouth 3 (three) times daily.    [provider]  oxyCODONE-acetaminophen (PERCOCET/ROXICET) 5-325 MG tablet Take 2 tablets by mouth every 8 (eight) hours as needed for severe pain. Patient not taking: Reported on 07/24/2018 12/06/17   McDonald, Pedro EarlsMia A, PA-C  predniSONE (STERAPRED UNI-PAK 21 TAB) 10 MG (21) TBPK tablet Take by mouth daily. Take 6 tabs by mouth daily  for 2 days, then 5 tabs for 2 days, then 4 tabs for 2 days, then 3 tabs for 2 days, 2 tabs for 2 days, then 1 tab by mouth daily for 2 days Patient not taking: Reported on 07/24/2018 12/06/17   McDonald, Mia A, PA-C  traMADol (ULTRAM) 50 MG tablet Take by mouth every 6 (six) hours as needed.    [provider]    Family History History reviewed. No pertinent family history.  Social History Social History   Tobacco Use  . Smoking status: Current Every Day Smoker    Packs/day: 1.00    Types: Cigarettes  . Smokeless tobacco: Never Used  Substance Use Topics  . Alcohol use: Yes    Alcohol/week: 6.0 standard drinks    Types: 6 Cans of beer per week  . Drug use: No    Types: Marijuana    Comment: denies use 01/05/16     Allergies   Patient has no known allergies.   Review of Systems Review of Systems  Musculoskeletal: Positive for joint swelling.  Skin: Positive for color change.  All other systems reviewed and are negative.    Physical Exam Triage Vital Signs ED Triage Vitals  Enc Vitals Group     BP      Pulse      Resp      Temp      Temp src      SpO2      Weight      Height      Head Circumference      Peak Flow      Pain Score      Pain Loc  Pain Edu?      Excl. in [place]?    No data found.  Updated Vital Signs BP 130/73 (BP Location: Right Arm)   Pulse 94   Temp 99.3 F (37.4 C) (Oral)   Resp 18   Wt 100.7 kg   SpO2 99%   BMI 30.96 kg/m    Physical Exam Vitals signs and nursing note reviewed.  Constitutional:      General: He is not in acute distress.    Appearance: Normal appearance.  Eyes:     Conjunctiva/sclera: Conjunctivae normal.  Neck:     Musculoskeletal: Normal range of motion and neck supple.  Cardiovascular:     Rate and Rhythm: Normal rate.  Pulmonary:     Effort: Pulmonary effort is normal.  Musculoskeletal:        General: Swelling, tenderness and deformity present.  Skin:    General: Skin is warm and dry.     Findings: Bruising present.     Comments: Some medial malleolar ecchymosis  Neurological:     General: No focal deficit present.     Mental Status: He is alert.  Psychiatric:        Mood and Affect: Mood normal.        Thought Content: Thought content normal.        Judgment: Judgment normal.          UC  Treatments / Results  Labs (all labs ordered are listed, but only abnormal results are displayed) Labs Reviewed - No data to display  EKG   Radiology No results found.  Procedures Procedures (including critical care time)  Medications Ordered in UC Medications - No data to display  Initial Impression / Assessment and Plan / UC Course  I have reviewed the triage vital signs and the nursing notes.  Pertinent labs & imaging results that were available during my care of the patient were reviewed by me and considered in my medical decision making (see chart for details).    Final Clinical Impressions(s) / UC Diagnoses   Final diagnoses:  None   Discharge Instructions   None    ED Prescriptions    None     I have reviewed the PDMP during this encounter.   Robyn Haber, MD 09/07/19 1202

## 2019-09-07 NOTE — Discharge Instructions (Signed)
You can take the Ultram for pain.  Try to elevate the ankle for the next couple days.  Follow-up with your orthopedist or primary care doctor if swelling persists

## 2019-09-07 NOTE — ED Triage Notes (Signed)
Pt states has right ankle pain. Pt states he was playing basketball yesterday. Pt states when he went up for the ball he came down on someone else foot.

## 2019-09-30 ENCOUNTER — Other Ambulatory Visit: Payer: Self-pay

## 2019-09-30 ENCOUNTER — Ambulatory Visit (HOSPITAL_COMMUNITY)
Admission: EM | Admit: 2019-09-30 | Discharge: 2019-09-30 | Disposition: A | Discharge status: Home / Self Care | Payer: Medicaid Other | Attending: Emergency Medicine | Admitting: Emergency Medicine

## 2019-09-30 ENCOUNTER — Encounter (HOSPITAL_COMMUNITY): Payer: Self-pay | Admitting: Emergency Medicine

## 2019-09-30 DIAGNOSIS — L0291 Cutaneous abscess, unspecified: Secondary | ICD-10-CM | POA: Diagnosis not present

## 2019-09-30 MED ORDER — SULFAMETHOXAZOLE-TRIMETHOPRIM 800-160 MG PO TABS: 1.0000 | ORAL_TABLET | Freq: Two times a day (BID) | ORAL | 0 refills | Status: AC

## 2019-09-30 NOTE — Discharge Instructions (Addendum)
Keep your wound clean and dry.  Wash it gently twice a day with soap and water.    Take the antibiotic as prescribed.  Encourage drainage from the wound.    Return here if you see signs of infection, such as increased pain, redness, warmth, fever, chills, or other concerning symptoms.

## 2019-09-30 NOTE — ED Triage Notes (Signed)
Pt reports an abscess to his left buttocks that he first noticed on Saturday.  He denies drainage and no fever.

## 2019-09-30 NOTE — ED Provider Notes (Signed)
[facility]    CSN: [id number] Arrival date & time: 09/30/19  3664      History   Chief Complaint Chief Complaint  Patient presents with   Abscess    HPI Brandon Hartman is a 33 y.o. male.   Patient presents with 3-day history of an abscess on his left buttock in his gluteal fold.  Denies fever/chills, numbness, paresthesias.  No history of prior abscess.  No treatments attempted at home.  The history is provided by the patient.    Past Medical History:  Diagnosis Date   ADHD (attention deficit hyperactivity disorder)    Anxiety attack     Patient Active Problem List   Diagnosis Date Noted   Anxiety state 11/30/2015   Tobacco use disorder 11/30/2015   Insomnia 11/30/2015    Past Surgical History:  Procedure Laterality Date   MOUTH SURGERY         Home Medications    Prior to Admission medications   Medication Sig Start Date End Date Taking? Authorizing Provider  traMADol (ULTRAM) 50 MG tablet Take by mouth every 6 (six) hours as needed.   Yes [provider]  gabapentin (NEURONTIN) 300 MG capsule Take 300 mg by mouth 3 (three) times daily.    [provider]  sulfamethoxazole-trimethoprim (BACTRIM DS) 800-160 MG tablet Take 1 tablet by mouth 2 (two) times daily for 10 days. 09/30/19 10/10/19  Mickie Bail, NP    Family History History reviewed. No pertinent family history.  Social History Social History   Tobacco Use   Smoking status: Current Every Day Smoker    Packs/day: 1.00    Types: Cigarettes   Smokeless tobacco: Never Used  Substance Use Topics   Alcohol use: Yes    Alcohol/week: 6.0 standard drinks    Types: 6 Cans of beer per week   Drug use: No    Types: Marijuana    Comment: denies use 01/05/16     Allergies   Patient has no known allergies.   Review of Systems Review of Systems  Constitutional: Negative for chills and fever.  HENT: Negative for ear pain and sore throat.   Eyes:  Negative for pain and visual disturbance.  Respiratory: Negative for cough and shortness of breath.   Cardiovascular: Negative for chest pain and palpitations.  Gastrointestinal: Negative for abdominal pain and vomiting.  Genitourinary: Negative for dysuria and hematuria.  Musculoskeletal: Negative for arthralgias and back pain.  Skin: Positive for wound. Negative for color change and rash.  Neurological: Negative for seizures and syncope.  All other systems reviewed and are negative.    Physical Exam Triage Vital Signs ED Triage Vitals [09/30/19 1008]  Enc Vitals Group     BP 120/84     Pulse Rate 88     Resp 18     Temp 98.3 F (36.8 C)     Temp Source Oral     SpO2 99 %     Weight      Height      Head Circumference      Peak Flow      Pain Score      Pain Loc      Pain Edu?      Excl. in [place]?    No data found.  Updated Vital Signs BP 120/84 (BP Location: Right Arm)    Pulse 88    Temp 98.3 F (36.8 C) (Oral)    Resp 18    SpO2  99%   Visual Acuity Right Eye Distance:   Left Eye Distance:   Bilateral Distance:    Right Eye Near:   Left Eye Near:    Bilateral Near:     Physical Exam Vitals signs and nursing note reviewed.  Constitutional:      Appearance: He is well-developed.  HENT:     Head: Normocephalic and atraumatic.  Eyes:     Conjunctiva/sclera: Conjunctivae normal.  Neck:     Musculoskeletal: Neck supple.  Cardiovascular:     Rate and Rhythm: Normal rate and regular rhythm.     Heart sounds: No murmur.  Pulmonary:     Effort: Pulmonary effort is normal. No respiratory distress.     Breath sounds: Normal breath sounds.  Abdominal:     Palpations: Abdomen is soft.     Tenderness: There is no abdominal tenderness. There is no guarding or rebound.  Skin:    General: Skin is warm and dry.     Capillary Refill: Capillary refill takes less than 2 seconds.     Findings: Lesion present.     Comments: Abscess on left buttock within gluteal fold  just beside anus: purulent, malodorous, yellow drainage. Localized erythema. Tender to palpation.   Neurological:     General: No focal deficit present.     Mental Status: He is alert and oriented to person, place, and time.     Sensory: No sensory deficit.     Motor: No weakness.      UC Treatments / Results  Labs (all labs ordered are listed, but only abnormal results are displayed) Labs Reviewed - No data to display  EKG   Radiology No results found.  Procedures Procedures (including critical care time)  Medications Ordered in UC Medications - No data to display  Initial Impression / Assessment and Plan / UC Course  I have reviewed the triage vital signs and the nursing notes.  Pertinent labs & imaging results that were available during my care of the patient were reviewed by me and considered in my medical decision making (see chart for details).   Abscess on left buttock.  Treating with Septra DS.  Wound care instructions discussed with patient.  Instructed him to return here if he develops signs of systemic infection.  Instructed him to follow-up with a surgeon such as the one suggested due to the location of his abscess.  Patient agrees to plan of care.    Final Clinical Impressions(s) / UC Diagnoses   Final diagnoses:  Abscess     Discharge Instructions     Keep your wound clean and dry.  Wash it gently twice a day with soap and water.    Take the antibiotic as prescribed.  Encourage drainage from the wound.    Return here if you see signs of infection, such as increased pain, redness, warmth, fever, chills, or other concerning symptoms.       ED Prescriptions    Medication Sig Dispense Auth. Provider   sulfamethoxazole-trimethoprim (BACTRIM DS) 800-160 MG tablet Take 1 tablet by mouth 2 (two) times daily for 10 days. 20 tablet Sharion Balloon, NP     PDMP not reviewed this encounter.   Sharion Balloon, NP 09/30/19 1046

## 2021-05-31 IMAGING — DX DG ANKLE COMPLETE 3+V*R*
3 series · 3 of 3 positions shown · non-contrast
Comparison: None.

CLINICAL DATA: Ankle injury playing basketball yesterday. Swelling.

EXAM:
RIGHT ANKLE - COMPLETE 3+ VIEW

[ankle ap]
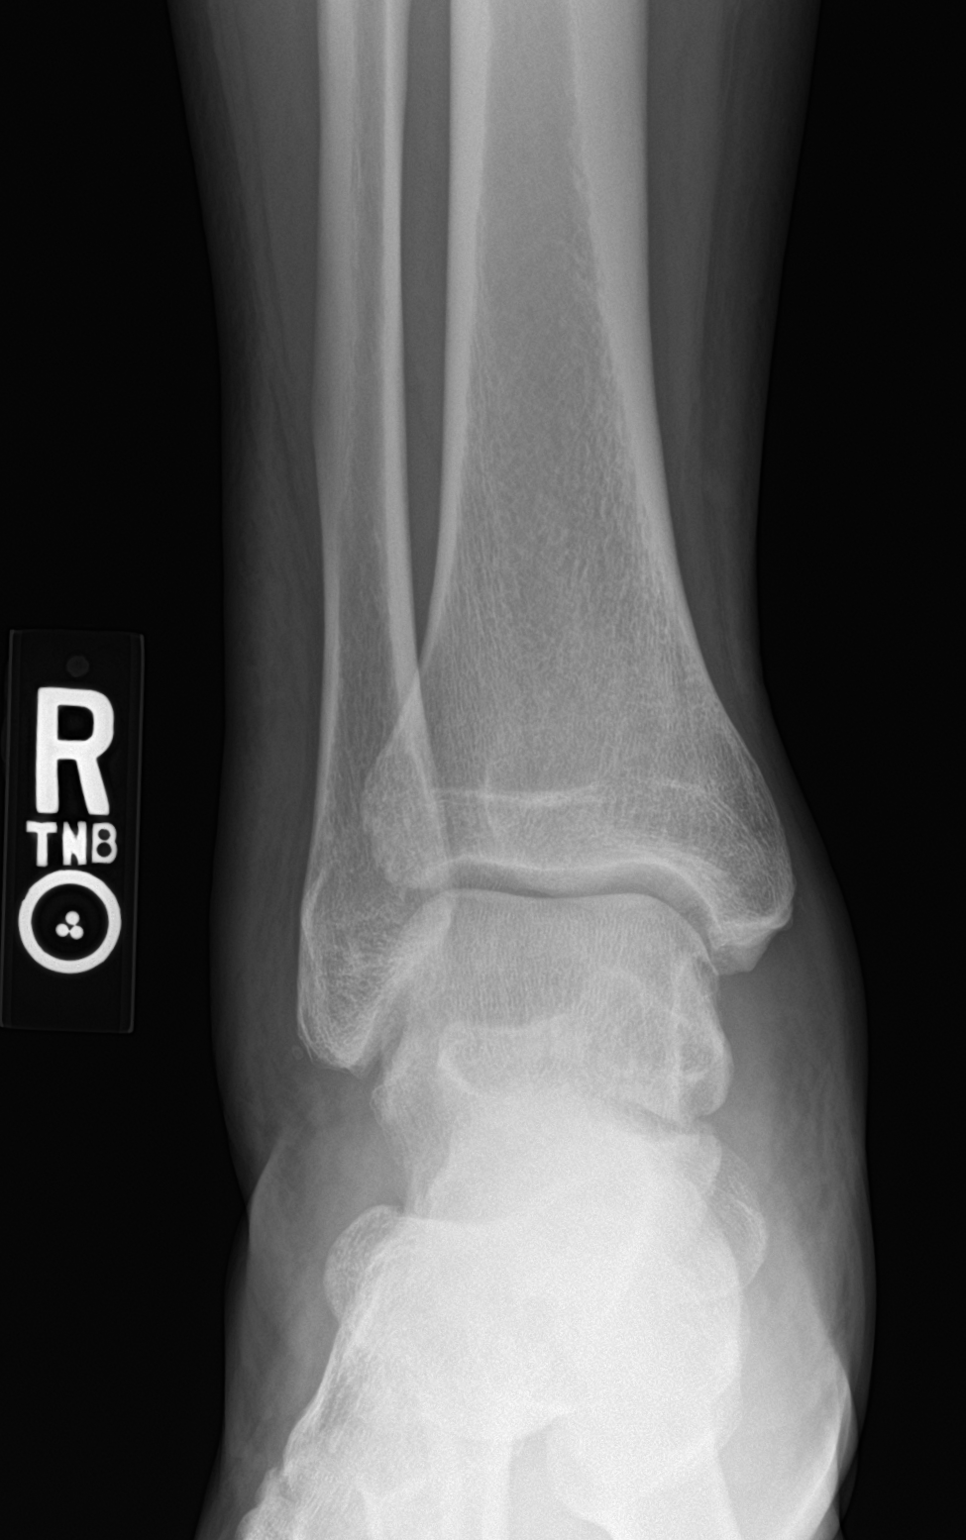

[ankle obl]
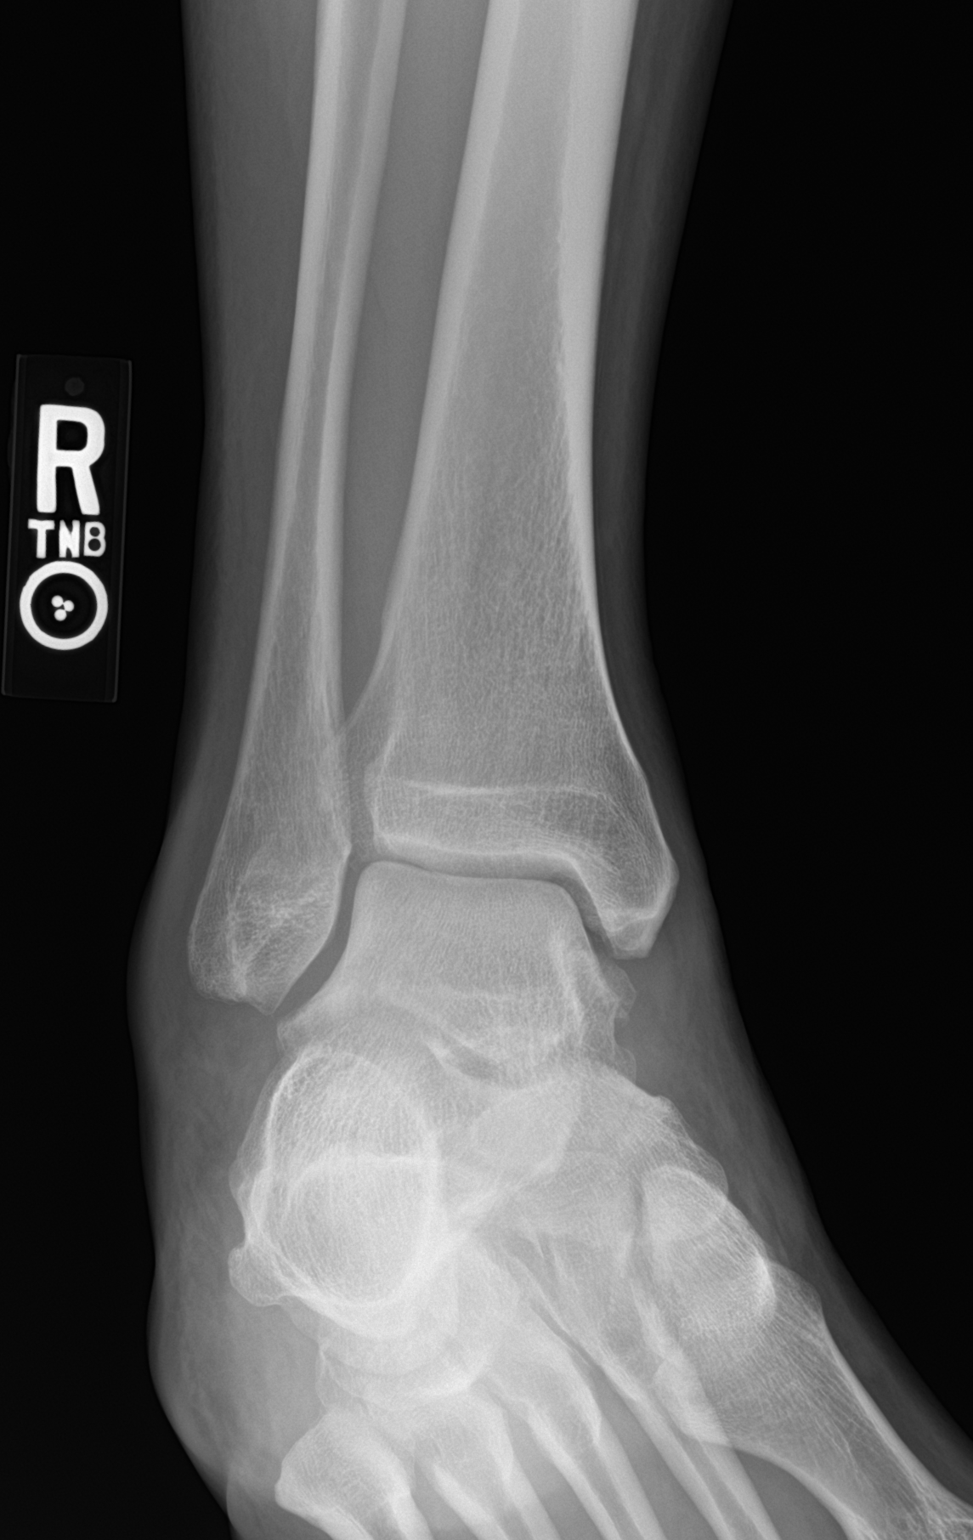

[ankle lat]
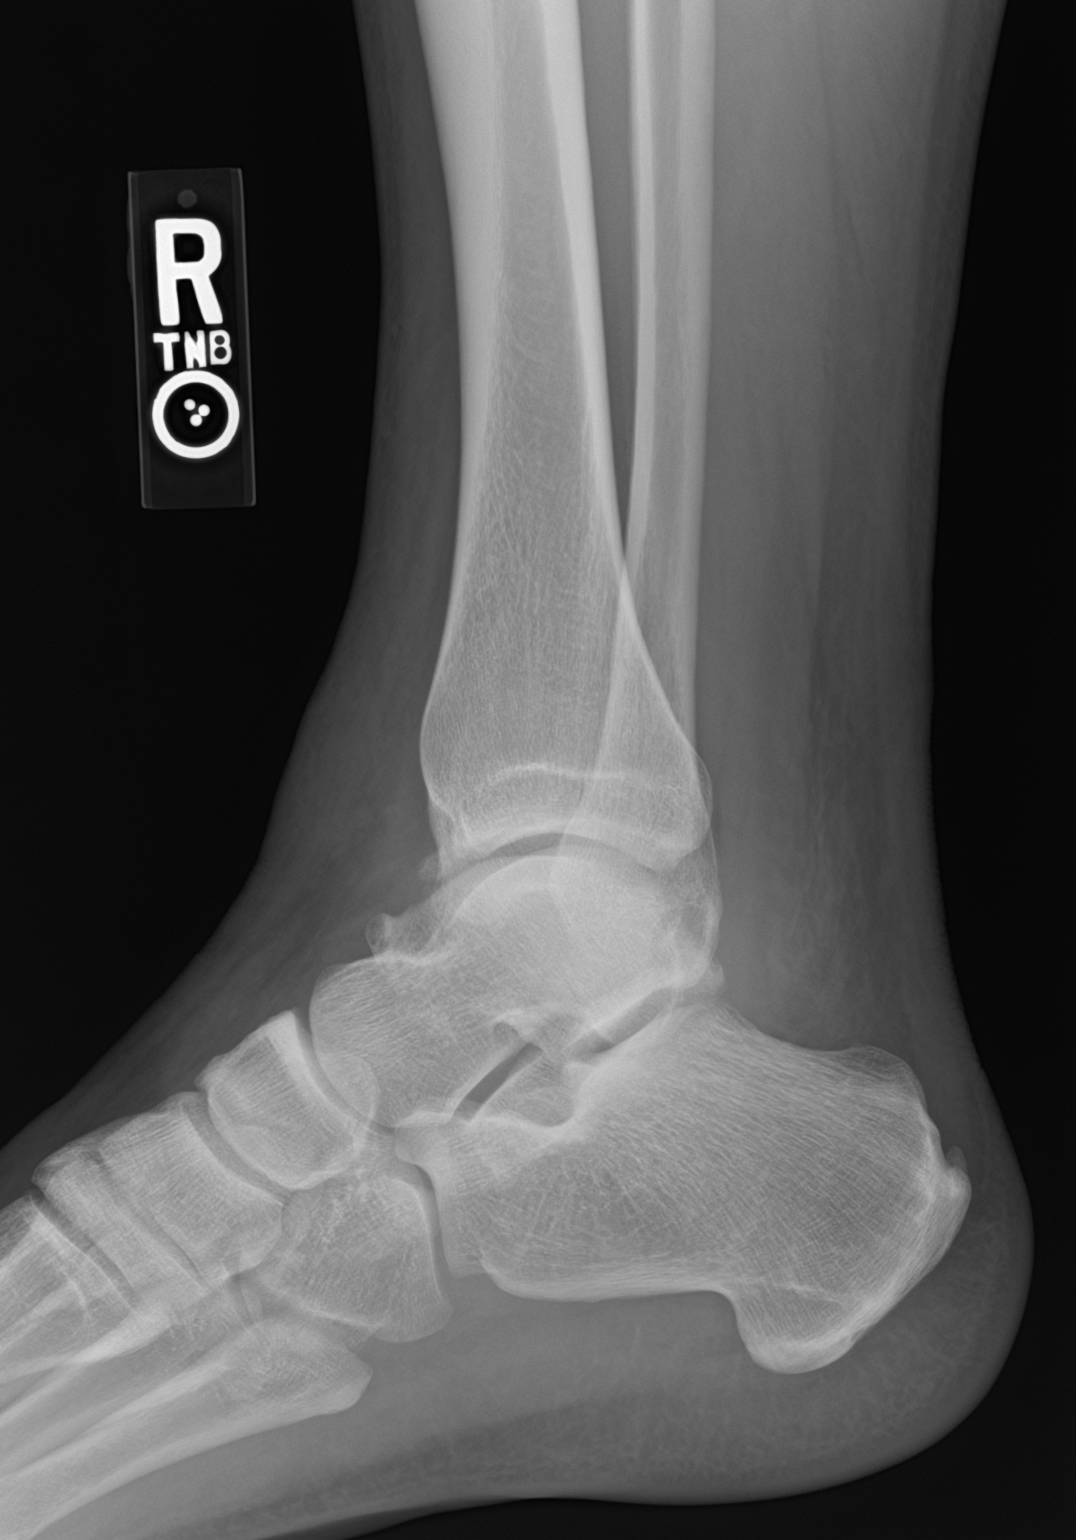

[3 of 3 positions shown; findings below may reference images not displayed]

FINDINGS: Soft tissue swelling. Osseous alignment is normal. Ankle mortise is
symmetric. No fracture line or displaced fracture fragment
appreciated. Visualized portions of the hindfoot and midfoot appear
intact and normally aligned.
IMPRESSION: Soft tissue swelling. No osseous fracture or dislocation.

## 2024-03-25 ENCOUNTER — Ambulatory Visit: Payer: Medicaid Other | Admitting: General Practice
